# Patient Record
Sex: Female | Born: 1945 | Race: White | Hispanic: No | Marital: Married | Smoking: Never smoker
Health system: Southern US, Community
[De-identification: ages and names within clinical notes are randomized; demographics above are authoritative.]

## PROBLEM LIST (undated history)

## (undated) DIAGNOSIS — R011 Cardiac murmur, unspecified: Secondary | ICD-10-CM

## (undated) DIAGNOSIS — E079 Disorder of thyroid, unspecified: Secondary | ICD-10-CM

## (undated) DIAGNOSIS — T7840XA Allergy, unspecified, initial encounter: Secondary | ICD-10-CM

## (undated) HISTORY — DX: Disorder of thyroid, unspecified: E07.9

## (undated) HISTORY — DX: Allergy, unspecified, initial encounter: T78.40XA

## (undated) HISTORY — DX: Cardiac murmur, unspecified: R01.1

---

## 2014-08-31 DIAGNOSIS — H33002 Unspecified retinal detachment with retinal break, left eye: Secondary | ICD-10-CM | POA: Diagnosis not present

## 2014-08-31 DIAGNOSIS — H43812 Vitreous degeneration, left eye: Secondary | ICD-10-CM | POA: Diagnosis not present

## 2014-11-14 ENCOUNTER — Ambulatory Visit (INDEPENDENT_AMBULATORY_CARE_PROVIDER_SITE_OTHER): Payer: Commercial Managed Care - HMO | Admitting: Emergency Medicine

## 2014-11-14 VITALS — BP 128/80 | HR 70 | Temp 98.4°F | Resp 16 | Ht 62.0 in | Wt 121.8 lb

## 2014-11-14 DIAGNOSIS — J209 Acute bronchitis, unspecified: Secondary | ICD-10-CM | POA: Diagnosis not present

## 2014-11-14 MED ORDER — AZITHROMYCIN 250 MG PO TABS: ORAL_TABLET | ORAL | Status: DC

## 2014-11-14 MED ORDER — HYDROCOD POLST-CPM POLST ER 10-8 MG/5ML PO SUER: 5.0000 mL | Freq: Two times a day (BID) | ORAL | Status: DC

## 2014-11-14 NOTE — Progress Notes (Signed)
Subjective:  Patient ID: Amber Carpenter, female    DOB: [birth year]  Age: 69 y.o. MRN: [id number]  CC: Cough and chest congestion   HPI Amber Carpenter presents  with a week's duration cough. She has a cough productive purulent sputum. She has some nasal congestion with no nasal discharge she has postnasal drainage. She denies any fever chills. No wheezing or shortness of breath. She has no nausea vomiting. No improvement with over-the-counter medication as a history of seasonal allergic rhinitis.  History Amber Carpenter has a past medical history of Allergy and Heart murmur.   She has no past surgical history on file.   Her  family history includes Cancer in her father; Heart disease in her father and mother; Hyperlipidemia in her father; Stroke in her father.  She   reports that she has never smoked. She has never used smokeless tobacco. She reports that she does not drink alcohol or use illicit drugs.  No outpatient prescriptions prior to visit.   No facility-administered medications prior to visit.    Social History   Social History  . Marital Status: Married    Spouse Name: N/A  . Number of Children: N/A  . Years of Education: N/A   Social History Main Topics  . Smoking status: Never Smoker   . Smokeless tobacco: Never Used  . Alcohol Use: No  . Drug Use: No  . Sexual Activity: Not Asked   Other Topics Concern  . None   Social History Narrative  . None     Review of Systems  Constitutional: Negative for fever, chills and appetite change.  HENT: Negative for congestion, ear pain, postnasal drip, sinus pressure and sore throat.   Eyes: Negative for pain and redness.  Respiratory: Positive for cough. Negative for shortness of breath and wheezing.   Cardiovascular: Negative for leg swelling.  Gastrointestinal: Negative for nausea, vomiting, abdominal pain, diarrhea, constipation and blood in stool.  Endocrine: Negative for polyuria.  Genitourinary: Negative for  dysuria, urgency, frequency and flank pain.  Musculoskeletal: Negative for gait problem.  Skin: Negative for rash.  Neurological: Negative for weakness and headaches.  Psychiatric/Behavioral: Negative for confusion and decreased concentration. The patient is not nervous/anxious.     Objective:  BP 128/80 mmHg  Pulse 70  Temp(Src) 98.4 F (36.9 C) (Oral)  Resp 16  Ht 5\' 2"  (1.575 m)  Wt 121 lb 12.8 oz (55.248 kg)  BMI 22.27 kg/m2  SpO2 98%  Physical Exam  Constitutional: She is oriented to person, place, and time. She appears well-developed and well-nourished. No distress.  HENT:  Head: Normocephalic and atraumatic.  Right Ear: External ear normal.  Left Ear: External ear normal.  Nose: Nose normal.  Eyes: Conjunctivae and EOM are normal. Pupils are equal, round, and reactive to light. No scleral icterus.  Neck: Normal range of motion. Neck supple. No tracheal deviation present.  Cardiovascular: Normal rate, regular rhythm and normal heart sounds.   Pulmonary/Chest: Effort normal. No respiratory distress. She has no wheezes. She has no rales.  Abdominal: She exhibits no mass. There is no tenderness. There is no rebound and no guarding.  Musculoskeletal: She exhibits no edema.  Lymphadenopathy:    She has no cervical adenopathy.  Neurological: She is alert and oriented to person, place, and time. Coordination normal.  Skin: Skin is warm and dry. No rash noted.  Psychiatric: She has a normal mood and affect. Her behavior is normal.      Assessment & Plan:  Lendy was seen today for cough and chest congestion.  Diagnoses and all orders for this visit:  Acute bronchitis, unspecified organism  Other orders -     azithromycin (ZITHROMAX) 250 MG tablet; Take 2 tabs PO x 1 dose, then 1 tab PO QD x 4 days -     chlorpheniramine-HYDROcodone (TUSSIONEX PENNKINETIC ER) 10-8 MG/5ML SUER; Take 5 mLs by mouth 2 (two) times daily.  I am having Ms. Bachmann start on azithromycin and  chlorpheniramine-HYDROcodone. I am also having her maintain her guaiFENesin-dextromethorphan.  Meds ordered this encounter  Medications  . guaiFENesin-dextromethorphan (ROBITUSSIN DM) 100-10 MG/5ML syrup    Sig: Take 5 mLs by mouth every 4 (four) hours as needed for cough.  Marland Kitchen azithromycin (ZITHROMAX) 250 MG tablet    Sig: Take 2 tabs PO x 1 dose, then 1 tab PO QD x 4 days    Dispense:  6 tablet    Refill:  0  . chlorpheniramine-HYDROcodone (TUSSIONEX PENNKINETIC ER) 10-8 MG/5ML SUER    Sig: Take 5 mLs by mouth 2 (two) times daily.    Dispense:  60 mL    Refill:  0    Appropriate red flag conditions were discussed with the patient as well as actions that should be taken.  Patient expressed his understanding.  Follow-up: Return if symptoms worsen or fail to improve.  Roselee Culver, MD

## 2014-11-14 NOTE — Patient Instructions (Signed)

## 2014-12-19 DIAGNOSIS — H25812 Combined forms of age-related cataract, left eye: Secondary | ICD-10-CM | POA: Diagnosis not present

## 2014-12-19 DIAGNOSIS — H5203 Hypermetropia, bilateral: Secondary | ICD-10-CM | POA: Diagnosis not present

## 2014-12-19 DIAGNOSIS — H40012 Open angle with borderline findings, low risk, left eye: Secondary | ICD-10-CM | POA: Diagnosis not present

## 2015-07-11 DIAGNOSIS — H2513 Age-related nuclear cataract, bilateral: Secondary | ICD-10-CM | POA: Diagnosis not present

## 2015-07-26 DIAGNOSIS — M19049 Primary osteoarthritis, unspecified hand: Secondary | ICD-10-CM | POA: Diagnosis not present

## 2015-07-26 DIAGNOSIS — M1990 Unspecified osteoarthritis, unspecified site: Secondary | ICD-10-CM | POA: Diagnosis not present

## 2015-07-26 DIAGNOSIS — R4589 Other symptoms and signs involving emotional state: Secondary | ICD-10-CM | POA: Diagnosis not present

## 2015-07-26 DIAGNOSIS — M7701 Medial epicondylitis, right elbow: Secondary | ICD-10-CM | POA: Diagnosis not present

## 2015-08-02 DIAGNOSIS — H2512 Age-related nuclear cataract, left eye: Secondary | ICD-10-CM | POA: Diagnosis not present

## 2015-08-02 DIAGNOSIS — H25812 Combined forms of age-related cataract, left eye: Secondary | ICD-10-CM | POA: Diagnosis not present

## 2015-10-23 ENCOUNTER — Other Ambulatory Visit: Payer: Self-pay | Admitting: Family Medicine

## 2015-10-23 DIAGNOSIS — Z136 Encounter for screening for cardiovascular disorders: Secondary | ICD-10-CM | POA: Diagnosis not present

## 2015-10-23 DIAGNOSIS — Z01419 Encounter for gynecological examination (general) (routine) without abnormal findings: Secondary | ICD-10-CM | POA: Diagnosis not present

## 2015-10-23 DIAGNOSIS — Z1211 Encounter for screening for malignant neoplasm of colon: Secondary | ICD-10-CM | POA: Diagnosis not present

## 2015-10-23 DIAGNOSIS — Z0000 Encounter for general adult medical examination without abnormal findings: Secondary | ICD-10-CM | POA: Diagnosis not present

## 2015-10-23 DIAGNOSIS — E782 Mixed hyperlipidemia: Secondary | ICD-10-CM | POA: Diagnosis not present

## 2015-10-23 DIAGNOSIS — Z23 Encounter for immunization: Secondary | ICD-10-CM | POA: Diagnosis not present

## 2015-11-02 DIAGNOSIS — Z1231 Encounter for screening mammogram for malignant neoplasm of breast: Secondary | ICD-10-CM | POA: Diagnosis not present

## 2015-12-31 DIAGNOSIS — K5790 Diverticulosis of intestine, part unspecified, without perforation or abscess without bleeding: Secondary | ICD-10-CM | POA: Diagnosis not present

## 2015-12-31 DIAGNOSIS — K5730 Diverticulosis of large intestine without perforation or abscess without bleeding: Secondary | ICD-10-CM | POA: Diagnosis not present

## 2015-12-31 DIAGNOSIS — Z1211 Encounter for screening for malignant neoplasm of colon: Secondary | ICD-10-CM | POA: Diagnosis not present

## 2016-01-14 DIAGNOSIS — Z0000 Encounter for general adult medical examination without abnormal findings: Secondary | ICD-10-CM | POA: Diagnosis not present

## 2016-01-14 DIAGNOSIS — E782 Mixed hyperlipidemia: Secondary | ICD-10-CM | POA: Diagnosis not present

## 2016-01-14 DIAGNOSIS — Z136 Encounter for screening for cardiovascular disorders: Secondary | ICD-10-CM | POA: Diagnosis not present

## 2016-01-14 DIAGNOSIS — Z79899 Other long term (current) drug therapy: Secondary | ICD-10-CM | POA: Diagnosis not present

## 2016-01-17 DIAGNOSIS — Z23 Encounter for immunization: Secondary | ICD-10-CM | POA: Diagnosis not present

## 2016-01-17 DIAGNOSIS — M19049 Primary osteoarthritis, unspecified hand: Secondary | ICD-10-CM | POA: Diagnosis not present

## 2016-01-17 DIAGNOSIS — Z6822 Body mass index (BMI) 22.0-22.9, adult: Secondary | ICD-10-CM | POA: Diagnosis not present

## 2016-01-17 DIAGNOSIS — E782 Mixed hyperlipidemia: Secondary | ICD-10-CM | POA: Diagnosis not present

## 2016-01-17 DIAGNOSIS — R946 Abnormal results of thyroid function studies: Secondary | ICD-10-CM | POA: Diagnosis not present

## 2016-02-26 DIAGNOSIS — R946 Abnormal results of thyroid function studies: Secondary | ICD-10-CM | POA: Diagnosis not present

## 2016-02-28 DIAGNOSIS — Z6822 Body mass index (BMI) 22.0-22.9, adult: Secondary | ICD-10-CM | POA: Diagnosis not present

## 2016-02-28 DIAGNOSIS — M19049 Primary osteoarthritis, unspecified hand: Secondary | ICD-10-CM | POA: Diagnosis not present

## 2016-02-28 DIAGNOSIS — R946 Abnormal results of thyroid function studies: Secondary | ICD-10-CM | POA: Diagnosis not present

## 2016-05-08 ENCOUNTER — Ambulatory Visit (INDEPENDENT_AMBULATORY_CARE_PROVIDER_SITE_OTHER): Payer: Medicare HMO | Admitting: Urgent Care

## 2016-05-08 VITALS — BP 120/70 | HR 73 | Temp 98.8°F | Resp 17 | Ht 62.0 in | Wt 126.0 lb

## 2016-05-08 DIAGNOSIS — R0981 Nasal congestion: Secondary | ICD-10-CM

## 2016-05-08 DIAGNOSIS — J3089 Other allergic rhinitis: Secondary | ICD-10-CM

## 2016-05-08 DIAGNOSIS — J0100 Acute maxillary sinusitis, unspecified: Secondary | ICD-10-CM

## 2016-05-08 MED ORDER — PSEUDOEPHEDRINE HCL 60 MG PO TABS: 60.0000 mg | ORAL_TABLET | Freq: Two times a day (BID) | ORAL | 0 refills | Status: AC | PRN

## 2016-05-08 MED ORDER — CETIRIZINE HCL 10 MG PO TABS: 10.0000 mg | ORAL_TABLET | Freq: Every day | ORAL | 11 refills | Status: AC

## 2016-05-08 MED ORDER — AMOXICILLIN 500 MG PO CAPS: 500.0000 mg | ORAL_CAPSULE | Freq: Two times a day (BID) | ORAL | 0 refills | Status: AC

## 2016-05-08 NOTE — Patient Instructions (Addendum)
Sinusitis, Adult Sinusitis is soreness and inflammation of your sinuses. Sinuses are hollow spaces in the bones around your face. Your sinuses are located:  Around your eyes.  In the middle of your forehead.  Behind your nose.  In your cheekbones. Your sinuses and nasal passages are lined with a stringy fluid (mucus). Mucus normally drains out of your sinuses. When your nasal tissues become inflamed or swollen, the mucus can become trapped or blocked so air cannot flow through your sinuses. This allows bacteria, viruses, and funguses to grow, which leads to infection. Sinusitis can develop quickly and last for 7?10 days (acute) or for more than 12 weeks (chronic). Sinusitis often develops after a cold. What are the causes? This condition is caused by anything that creates swelling in the sinuses or stops mucus from draining, including:  Allergies.  Asthma.  Bacterial or viral infection.  Abnormally shaped bones between the nasal passages.  Nasal growths that contain mucus (nasal polyps).  Narrow sinus openings.  Pollutants, such as chemicals or irritants in the air.  A foreign object stuck in the nose.  A fungal infection. This is rare. What increases the risk? The following factors may make you more likely to develop this condition:  Having allergies or asthma.  Having had a recent cold or respiratory tract infection.  Having structural deformities or blockages in your nose or sinuses.  Having a weak immune system.  Doing a lot of swimming or diving.  Overusing nasal sprays.  Smoking. What are the signs or symptoms? The main symptoms of this condition are pain and a feeling of pressure around the affected sinuses. Other symptoms include:  Upper toothache.  Earache.  Headache.  Bad breath.  Decreased sense of smell and taste.  A cough that may get worse at night.  Fatigue.  Fever.  Thick drainage from your nose. The drainage is often green and it may  contain pus (purulent).  Stuffy nose or congestion.  Postnasal drip. This is when extra mucus collects in the throat or back of the nose.  Swelling and warmth over the affected sinuses.  Sore throat.  Sensitivity to light. How is this diagnosed? This condition is diagnosed based on symptoms, a medical history, and a physical exam. To find out if your condition is acute or chronic, your health care provider may:  Look in your nose for signs of nasal polyps.  Tap over the affected sinus to check for signs of infection.  View the inside of your sinuses using an imaging device that has a light attached (endoscope). If your health care provider suspects that you have chronic sinusitis, you may also:  Be tested for allergies.  Have a sample of mucus taken from your nose (nasal culture) and checked for bacteria.  Have a mucus sample examined to see if your sinusitis is related to an allergy. If your sinusitis does not respond to treatment and it lasts longer than 8 weeks, you may have an MRI or CT scan to check your sinuses. These scans also help to determine how severe your infection is. In rare cases, a bone biopsy may be done to rule out more serious types of fungal sinus disease. How is this treated? Treatment for sinusitis depends on the cause and whether your condition is chronic or acute. If a virus is causing your sinusitis, your symptoms will go away on their own within 10 days. You may be given medicines to relieve your symptoms, including:  Topical nasal decongestants. They   shrink swollen nasal passages and let mucus drain from your sinuses.  Antihistamines. These drugs block inflammation that is triggered by allergies. This can help to ease swelling in your nose and sinuses.  Topical nasal corticosteroids. These are nasal sprays that ease inflammation and swelling in your nose and sinuses.  Nasal saline washes. These rinses can help to get rid of thick mucus in your  nose. If your condition is caused by bacteria, you will be given an antibiotic medicine. If your condition is caused by a fungus, you will be given an antifungal medicine. Surgery may be needed to correct underlying conditions, such as narrow nasal passages. Surgery may also be needed to remove polyps. Follow these instructions at home: Medicines  Take, use, or apply over-the-counter and prescription medicines only as told by your health care provider. These may include nasal sprays.  If you were prescribed an antibiotic medicine, take it as told by your health care provider. Do not stop taking the antibiotic even if you start to feel better. Hydrate and Humidify  Drink enough water to keep your urine clear or pale yellow. Staying hydrated will help to thin your mucus.  Use a cool mist humidifier to keep the humidity level in your home above 50%.  Inhale steam for 10-15 minutes, 3-4 times a day or as told by your health care provider. You can do this in the bathroom while a hot shower is running.  Limit your exposure to cool or dry air. Rest  Rest as much as possible.  Sleep with your head raised (elevated).  Make sure to get enough sleep each night. General instructions  Apply a warm, moist washcloth to your face 3-4 times a day or as told by your health care provider. This will help with discomfort.  Wash your hands often with soap and water to reduce your exposure to viruses and other germs. If soap and water are not available, use hand sanitizer.  Do not smoke. Avoid being around people who are smoking (secondhand smoke).  Keep all follow-up visits as told by your health care provider. This is important. Contact a health care provider if:  You have a fever.  Your symptoms get worse.  Your symptoms do not improve within 10 days. Get help right away if:  You have a severe headache.  You have persistent vomiting.  You have pain or swelling around your face or  eyes.  You have vision problems.  You develop confusion.  Your neck is stiff.  You have trouble breathing. This information is not intended to replace advice given to you by your health care provider. Make sure you discuss any questions you have with your health care provider. Document Released: 03/17/2005 Document Revised: 11/11/2015 Document Reviewed: 01/10/2015 Elsevier Interactive Patient Education  2017 [employer].      We recommend that you schedule a mammogram for breast cancer screening. Typically, you do not need a referral to do this. Please contact a local imaging center to schedule your mammogram.  [facility] - [phone]  *ask for the Radiology Department [facility] ([place]) - [phone] or [phone]  [facility] - [phone] [place] [phone] [facility] - [phone]  *ask for the [facility] - [phone]  *ask for the Radiology Department [facility] - [phone]  *ask for the [place] - [phone]    IF you received  an x-ray today, you will receive an invoice from [facility] Radiology. Please contact [facility] Radiology at [phone] with questions or concerns regarding your invoice.   IF you received labwork today, you will receive an invoice from [facility]. Please contact [facility] at [phone] with questions or concerns regarding your invoice.   Our billing staff will not be able to assist you with questions regarding bills from these companies.  You will be contacted with the lab results as soon as they are available. The fastest way to get your results is to activate your My Chart account. Instructions are located on the last page of this paperwork. If you have not heard from [place] regarding the results in 2 weeks, please contact this office.

## 2016-05-08 NOTE — Progress Notes (Signed)
    MRN: [id number] DOB: 1945/05/17  Subjective:   Amber Carpenter is a 71 y.o. female presenting for chief complaint of Cough (onset 3 weeks/ not much now) and Sinusitis (onset 1 week)  Reports 1 week history of sinus congestion, sinus pain, upper tooth pain, subjective fever. Reports 3 week history of productive cough which is now resolved over the past couple of days but still feels chest congestion. Takes Flonase for her allergies which helps with her itchy eyes. Denies sore throat, ear pain, ear drainage, chest pain, shob, n/v, abdominal pain. Denies smoking cigarettes.   Amber Carpenter has a current medication list which includes the following prescription(s): diclofenac sodium, fluticasone, and glucosamine-chondroitin. Also is allergic to epinephrine.  Amber Carpenter  has a past medical history of Allergy and Heart murmur. Also has laparoscopic surgery for miscarriage.  Objective:   Vitals: BP 120/70 (BP Location: Right Arm, Patient Position: Sitting, Cuff Size: Normal)   Pulse 73   Temp 98.8 F (37.1 C) (Oral)   Resp 17   Ht 5\' 2"  (1.575 m)   Wt 126 lb (57.2 kg)   SpO2 100%   BMI 23.05 kg/m   Physical Exam  Constitutional: She is oriented to person, place, and time. She appears well-developed and well-nourished.  HENT:  TM's intact bilaterally, no effusions or erythema. Nasal turbinates erythematous and dry, nasal passages patent. Bilateral maxillary sinus tenderness. Oropharynx clear, mucous membranes moist, dentition in good repair.  Eyes: Right eye exhibits no discharge. Left eye exhibits no discharge.  Neck: Normal range of motion. Neck supple.  Cardiovascular: Normal rate, regular rhythm and intact distal pulses.  Exam reveals no gallop and no friction rub.   No murmur heard. Pulmonary/Chest: No respiratory distress. She has no wheezes. She has no rales.  Lymphadenopathy:    She has no cervical adenopathy.  Neurological: She is alert and oriented to person, place, and time.    Skin: Skin is warm and dry.   Assessment and Plan :   1. Acute maxillary sinusitis, recurrence not specified 2. Sinus congestion 3. Environmental and seasonal allergies - Start Zyrtec with Flonase and use Sudafed 60mg  as needed. If no improvement by Sunday, okay to fill script for amoxicillin to address bacterial sinusitis. Patient verbalized understanding potential for adverse effects including secondary infections such as C. Diff.  Jaynee Eagles, PA-C [facility] [phone] 05/08/2016  4:55 PM

## 2016-07-09 DIAGNOSIS — E039 Hypothyroidism, unspecified: Secondary | ICD-10-CM | POA: Diagnosis not present

## 2016-07-09 DIAGNOSIS — Z79899 Other long term (current) drug therapy: Secondary | ICD-10-CM | POA: Diagnosis not present

## 2016-07-18 DIAGNOSIS — L918 Other hypertrophic disorders of the skin: Secondary | ICD-10-CM | POA: Diagnosis not present

## 2016-07-18 DIAGNOSIS — M19049 Primary osteoarthritis, unspecified hand: Secondary | ICD-10-CM | POA: Diagnosis not present

## 2016-07-18 DIAGNOSIS — E039 Hypothyroidism, unspecified: Secondary | ICD-10-CM | POA: Diagnosis not present

## 2016-07-18 DIAGNOSIS — Z6822 Body mass index (BMI) 22.0-22.9, adult: Secondary | ICD-10-CM | POA: Diagnosis not present

## 2016-08-15 DIAGNOSIS — R05 Cough: Secondary | ICD-10-CM | POA: Diagnosis not present

## 2016-08-15 DIAGNOSIS — J0100 Acute maxillary sinusitis, unspecified: Secondary | ICD-10-CM | POA: Diagnosis not present

## 2016-08-15 DIAGNOSIS — J309 Allergic rhinitis, unspecified: Secondary | ICD-10-CM | POA: Diagnosis not present

## 2016-09-15 DIAGNOSIS — E039 Hypothyroidism, unspecified: Secondary | ICD-10-CM | POA: Diagnosis not present

## 2016-09-16 DIAGNOSIS — M19049 Primary osteoarthritis, unspecified hand: Secondary | ICD-10-CM | POA: Diagnosis not present

## 2016-09-16 DIAGNOSIS — L918 Other hypertrophic disorders of the skin: Secondary | ICD-10-CM | POA: Diagnosis not present

## 2016-09-16 DIAGNOSIS — E039 Hypothyroidism, unspecified: Secondary | ICD-10-CM | POA: Diagnosis not present

## 2016-09-16 DIAGNOSIS — Z6823 Body mass index (BMI) 23.0-23.9, adult: Secondary | ICD-10-CM | POA: Diagnosis not present

## 2017-02-10 DIAGNOSIS — Z23 Encounter for immunization: Secondary | ICD-10-CM | POA: Diagnosis not present

## 2017-02-10 DIAGNOSIS — Z6823 Body mass index (BMI) 23.0-23.9, adult: Secondary | ICD-10-CM | POA: Diagnosis not present

## 2017-02-10 DIAGNOSIS — L918 Other hypertrophic disorders of the skin: Secondary | ICD-10-CM | POA: Diagnosis not present

## 2017-02-10 DIAGNOSIS — E039 Hypothyroidism, unspecified: Secondary | ICD-10-CM | POA: Diagnosis not present

## 2017-02-10 DIAGNOSIS — Z0000 Encounter for general adult medical examination without abnormal findings: Secondary | ICD-10-CM | POA: Diagnosis not present

## 2017-02-17 DIAGNOSIS — L918 Other hypertrophic disorders of the skin: Secondary | ICD-10-CM | POA: Diagnosis not present

## 2017-02-17 DIAGNOSIS — Z0000 Encounter for general adult medical examination without abnormal findings: Secondary | ICD-10-CM | POA: Diagnosis not present

## 2017-02-17 DIAGNOSIS — Z79899 Other long term (current) drug therapy: Secondary | ICD-10-CM | POA: Diagnosis not present

## 2017-04-06 DIAGNOSIS — R946 Abnormal results of thyroid function studies: Secondary | ICD-10-CM | POA: Diagnosis not present

## 2017-04-09 DIAGNOSIS — E039 Hypothyroidism, unspecified: Secondary | ICD-10-CM | POA: Diagnosis not present

## 2017-04-09 DIAGNOSIS — M1990 Unspecified osteoarthritis, unspecified site: Secondary | ICD-10-CM | POA: Diagnosis not present

## 2017-04-09 DIAGNOSIS — Z6822 Body mass index (BMI) 22.0-22.9, adult: Secondary | ICD-10-CM | POA: Diagnosis not present

## 2017-04-09 DIAGNOSIS — E782 Mixed hyperlipidemia: Secondary | ICD-10-CM | POA: Diagnosis not present

## 2017-05-13 DIAGNOSIS — H2511 Age-related nuclear cataract, right eye: Secondary | ICD-10-CM | POA: Diagnosis not present

## 2017-05-13 DIAGNOSIS — H35373 Puckering of macula, bilateral: Secondary | ICD-10-CM | POA: Diagnosis not present

## 2017-05-13 DIAGNOSIS — H5201 Hypermetropia, right eye: Secondary | ICD-10-CM | POA: Diagnosis not present

## 2017-05-28 DIAGNOSIS — H21561 Pupillary abnormality, right eye: Secondary | ICD-10-CM | POA: Diagnosis not present

## 2017-05-28 DIAGNOSIS — H401410 Capsular glaucoma with pseudoexfoliation of lens, right eye, stage unspecified: Secondary | ICD-10-CM | POA: Diagnosis not present

## 2017-05-28 DIAGNOSIS — H25811 Combined forms of age-related cataract, right eye: Secondary | ICD-10-CM | POA: Diagnosis not present

## 2017-05-28 DIAGNOSIS — H2511 Age-related nuclear cataract, right eye: Secondary | ICD-10-CM | POA: Diagnosis not present

## 2017-06-09 DIAGNOSIS — R946 Abnormal results of thyroid function studies: Secondary | ICD-10-CM | POA: Diagnosis not present

## 2017-06-11 DIAGNOSIS — E039 Hypothyroidism, unspecified: Secondary | ICD-10-CM | POA: Diagnosis not present

## 2017-06-11 DIAGNOSIS — L570 Actinic keratosis: Secondary | ICD-10-CM | POA: Diagnosis not present

## 2017-06-11 DIAGNOSIS — L853 Xerosis cutis: Secondary | ICD-10-CM | POA: Diagnosis not present

## 2017-06-11 DIAGNOSIS — Z6823 Body mass index (BMI) 23.0-23.9, adult: Secondary | ICD-10-CM | POA: Diagnosis not present

## 2017-12-14 DIAGNOSIS — E039 Hypothyroidism, unspecified: Secondary | ICD-10-CM | POA: Diagnosis not present

## 2017-12-17 DIAGNOSIS — J309 Allergic rhinitis, unspecified: Secondary | ICD-10-CM | POA: Diagnosis not present

## 2017-12-17 DIAGNOSIS — H6091 Unspecified otitis externa, right ear: Secondary | ICD-10-CM | POA: Diagnosis not present

## 2017-12-17 DIAGNOSIS — E039 Hypothyroidism, unspecified: Secondary | ICD-10-CM | POA: Diagnosis not present

## 2017-12-17 DIAGNOSIS — Z6823 Body mass index (BMI) 23.0-23.9, adult: Secondary | ICD-10-CM | POA: Diagnosis not present

## 2017-12-25 DIAGNOSIS — Z23 Encounter for immunization: Secondary | ICD-10-CM | POA: Diagnosis not present

## 2017-12-30 DIAGNOSIS — H35373 Puckering of macula, bilateral: Secondary | ICD-10-CM | POA: Diagnosis not present

## 2017-12-30 DIAGNOSIS — H40013 Open angle with borderline findings, low risk, bilateral: Secondary | ICD-10-CM | POA: Diagnosis not present

## 2018-02-16 DIAGNOSIS — Z6823 Body mass index (BMI) 23.0-23.9, adult: Secondary | ICD-10-CM | POA: Diagnosis not present

## 2018-02-16 DIAGNOSIS — Z1211 Encounter for screening for malignant neoplasm of colon: Secondary | ICD-10-CM | POA: Diagnosis not present

## 2018-02-16 DIAGNOSIS — Z0000 Encounter for general adult medical examination without abnormal findings: Secondary | ICD-10-CM | POA: Diagnosis not present

## 2018-04-05 DIAGNOSIS — E039 Hypothyroidism, unspecified: Secondary | ICD-10-CM | POA: Diagnosis not present

## 2018-04-05 DIAGNOSIS — E782 Mixed hyperlipidemia: Secondary | ICD-10-CM | POA: Diagnosis not present

## 2018-04-12 DIAGNOSIS — J309 Allergic rhinitis, unspecified: Secondary | ICD-10-CM | POA: Diagnosis not present

## 2018-04-12 DIAGNOSIS — E039 Hypothyroidism, unspecified: Secondary | ICD-10-CM | POA: Diagnosis not present

## 2018-04-12 DIAGNOSIS — Z6823 Body mass index (BMI) 23.0-23.9, adult: Secondary | ICD-10-CM | POA: Diagnosis not present

## 2018-04-12 DIAGNOSIS — E782 Mixed hyperlipidemia: Secondary | ICD-10-CM | POA: Diagnosis not present

## 2018-09-15 DIAGNOSIS — J309 Allergic rhinitis, unspecified: Secondary | ICD-10-CM | POA: Diagnosis not present

## 2019-04-13 DIAGNOSIS — D23122 Other benign neoplasm of skin of left lower eyelid, including canthus: Secondary | ICD-10-CM | POA: Diagnosis not present

## 2019-04-13 DIAGNOSIS — Z961 Presence of intraocular lens: Secondary | ICD-10-CM | POA: Diagnosis not present

## 2019-04-13 DIAGNOSIS — H35373 Puckering of macula, bilateral: Secondary | ICD-10-CM | POA: Diagnosis not present

## 2019-04-13 DIAGNOSIS — H02105 Unspecified ectropion of left lower eyelid: Secondary | ICD-10-CM | POA: Diagnosis not present

## 2019-04-22 ENCOUNTER — Ambulatory Visit: Payer: Medicare HMO | Attending: Internal Medicine

## 2019-04-22 DIAGNOSIS — Z23 Encounter for immunization: Secondary | ICD-10-CM | POA: Insufficient documentation

## 2019-04-22 NOTE — Progress Notes (Signed)
   Covid-19 Vaccination Clinic  Name:  Amber Carpenter    MRN: [id number] DOB: Mar 22, 1946  04/22/2019  Amber Carpenter was observed post Covid-19 immunization for 15 minutes without incidence. She was provided with Vaccine Information Sheet and instruction to access the V-Safe system.   Amber Carpenter was instructed to call 911 with any severe reactions post vaccine: Marland Kitchen Difficulty breathing  . Swelling of your face and throat  . A fast heartbeat  . A bad rash all over your body  . Dizziness and weakness    Immunizations Administered    Name Date Dose VIS Date Route   Pfizer COVID-19 Vaccine 04/22/2019  8:43 AM 0.3 mL 03/11/2019 Intramuscular   Manufacturer: [place]   Lot: [id number]   [place]: [id number]

## 2019-05-13 ENCOUNTER — Ambulatory Visit: Payer: Medicare HMO | Attending: Internal Medicine

## 2019-05-13 DIAGNOSIS — Z23 Encounter for immunization: Secondary | ICD-10-CM | POA: Insufficient documentation

## 2019-05-13 NOTE — Progress Notes (Signed)
   Covid-19 Vaccination Clinic  Name:  Amber Carpenter    MRN: [id number] DOB: Oct 11, 1945  05/13/2019  [name] was observed post Covid-19 immunization for 15 minutes without incidence. She was provided with Vaccine Information Sheet and instruction to access the V-Safe system.   [name] was instructed to call 911 with any severe reactions post vaccine: Marland Kitchen Difficulty breathing  . Swelling of your face and throat  . A fast heartbeat  . A bad rash all over your body  . Dizziness and weakness    Immunizations Administered    Name Date Dose VIS Date Route   Pfizer COVID-19 Vaccine 05/13/2019  9:34 AM 0.3 mL 03/11/2019 Intramuscular   Manufacturer: [place]   Lot: [id number]   [place]: [id number]

## 2019-05-19 DIAGNOSIS — J309 Allergic rhinitis, unspecified: Secondary | ICD-10-CM | POA: Diagnosis not present

## 2019-05-19 DIAGNOSIS — Z0000 Encounter for general adult medical examination without abnormal findings: Secondary | ICD-10-CM | POA: Diagnosis not present

## 2019-05-19 DIAGNOSIS — E039 Hypothyroidism, unspecified: Secondary | ICD-10-CM | POA: Diagnosis not present

## 2019-05-19 DIAGNOSIS — Z6824 Body mass index (BMI) 24.0-24.9, adult: Secondary | ICD-10-CM | POA: Diagnosis not present

## 2019-05-19 DIAGNOSIS — E782 Mixed hyperlipidemia: Secondary | ICD-10-CM | POA: Diagnosis not present

## 2019-05-26 DIAGNOSIS — Z23 Encounter for immunization: Secondary | ICD-10-CM | POA: Diagnosis not present

## 2019-06-22 DIAGNOSIS — L600 Ingrowing nail: Secondary | ICD-10-CM | POA: Diagnosis not present

## 2019-07-12 DIAGNOSIS — Z23 Encounter for immunization: Secondary | ICD-10-CM | POA: Diagnosis not present

## 2019-07-13 DIAGNOSIS — Z0000 Encounter for general adult medical examination without abnormal findings: Secondary | ICD-10-CM | POA: Diagnosis not present

## 2019-07-13 DIAGNOSIS — R635 Abnormal weight gain: Secondary | ICD-10-CM | POA: Diagnosis not present

## 2019-07-13 DIAGNOSIS — E782 Mixed hyperlipidemia: Secondary | ICD-10-CM | POA: Diagnosis not present

## 2019-07-13 DIAGNOSIS — R946 Abnormal results of thyroid function studies: Secondary | ICD-10-CM | POA: Diagnosis not present

## 2019-07-19 DIAGNOSIS — E782 Mixed hyperlipidemia: Secondary | ICD-10-CM | POA: Diagnosis not present

## 2019-07-19 DIAGNOSIS — J309 Allergic rhinitis, unspecified: Secondary | ICD-10-CM | POA: Diagnosis not present

## 2019-07-19 DIAGNOSIS — E039 Hypothyroidism, unspecified: Secondary | ICD-10-CM | POA: Diagnosis not present

## 2019-10-10 DIAGNOSIS — E782 Mixed hyperlipidemia: Secondary | ICD-10-CM | POA: Diagnosis not present

## 2019-10-10 DIAGNOSIS — Z9189 Other specified personal risk factors, not elsewhere classified: Secondary | ICD-10-CM | POA: Diagnosis not present

## 2019-10-11 DIAGNOSIS — E1169 Type 2 diabetes mellitus with other specified complication: Secondary | ICD-10-CM | POA: Diagnosis not present

## 2019-10-11 DIAGNOSIS — E782 Mixed hyperlipidemia: Secondary | ICD-10-CM | POA: Diagnosis not present

## 2019-10-11 DIAGNOSIS — J309 Allergic rhinitis, unspecified: Secondary | ICD-10-CM | POA: Diagnosis not present

## 2019-10-11 DIAGNOSIS — M170 Bilateral primary osteoarthritis of knee: Secondary | ICD-10-CM | POA: Diagnosis not present

## 2020-01-30 ENCOUNTER — Ambulatory Visit
Admission: RE | Admit: 2020-01-30 | Discharge: 2020-01-30 | Disposition: A | Discharge status: Home / Self Care | Payer: Medicare HMO | Source: Ambulatory Visit | Attending: Family Medicine | Admitting: Family Medicine

## 2020-01-30 ENCOUNTER — Other Ambulatory Visit: Payer: Self-pay | Admitting: Family Medicine

## 2020-01-30 DIAGNOSIS — S8992XA Unspecified injury of left lower leg, initial encounter: Secondary | ICD-10-CM | POA: Diagnosis not present

## 2020-01-30 DIAGNOSIS — G4700 Insomnia, unspecified: Secondary | ICD-10-CM | POA: Diagnosis not present

## 2020-01-30 DIAGNOSIS — M25562 Pain in left knee: Secondary | ICD-10-CM

## 2020-01-30 DIAGNOSIS — M25561 Pain in right knee: Secondary | ICD-10-CM

## 2020-01-30 DIAGNOSIS — M1990 Unspecified osteoarthritis, unspecified site: Secondary | ICD-10-CM | POA: Diagnosis not present

## 2020-01-30 DIAGNOSIS — M1711 Unilateral primary osteoarthritis, right knee: Secondary | ICD-10-CM | POA: Diagnosis not present

## 2020-01-30 DIAGNOSIS — M25462 Effusion, left knee: Secondary | ICD-10-CM | POA: Diagnosis not present

## 2020-02-02 DIAGNOSIS — M170 Bilateral primary osteoarthritis of knee: Secondary | ICD-10-CM | POA: Diagnosis not present

## 2020-02-02 DIAGNOSIS — M1712 Unilateral primary osteoarthritis, left knee: Secondary | ICD-10-CM | POA: Diagnosis not present

## 2020-02-02 DIAGNOSIS — M1711 Unilateral primary osteoarthritis, right knee: Secondary | ICD-10-CM | POA: Diagnosis not present

## 2020-02-20 DIAGNOSIS — Z20822 Contact with and (suspected) exposure to covid-19: Secondary | ICD-10-CM | POA: Diagnosis not present

## 2020-03-06 DIAGNOSIS — Z7185 Encounter for immunization safety counseling: Secondary | ICD-10-CM | POA: Diagnosis not present

## 2020-03-06 DIAGNOSIS — M170 Bilateral primary osteoarthritis of knee: Secondary | ICD-10-CM | POA: Diagnosis not present

## 2020-03-06 DIAGNOSIS — Z23 Encounter for immunization: Secondary | ICD-10-CM | POA: Diagnosis not present

## 2020-08-01 DIAGNOSIS — E782 Mixed hyperlipidemia: Secondary | ICD-10-CM | POA: Diagnosis not present

## 2020-08-07 DIAGNOSIS — M1711 Unilateral primary osteoarthritis, right knee: Secondary | ICD-10-CM | POA: Diagnosis not present

## 2020-08-07 DIAGNOSIS — M1712 Unilateral primary osteoarthritis, left knee: Secondary | ICD-10-CM | POA: Diagnosis not present

## 2020-08-07 DIAGNOSIS — E782 Mixed hyperlipidemia: Secondary | ICD-10-CM | POA: Diagnosis not present

## 2020-08-07 DIAGNOSIS — M170 Bilateral primary osteoarthritis of knee: Secondary | ICD-10-CM | POA: Diagnosis not present

## 2020-08-07 DIAGNOSIS — Z23 Encounter for immunization: Secondary | ICD-10-CM | POA: Diagnosis not present

## 2020-08-07 DIAGNOSIS — J309 Allergic rhinitis, unspecified: Secondary | ICD-10-CM | POA: Diagnosis not present

## 2020-08-30 DIAGNOSIS — Z1331 Encounter for screening for depression: Secondary | ICD-10-CM | POA: Diagnosis not present

## 2020-08-30 DIAGNOSIS — Z1339 Encounter for screening examination for other mental health and behavioral disorders: Secondary | ICD-10-CM | POA: Diagnosis not present

## 2020-08-30 DIAGNOSIS — Z0000 Encounter for general adult medical examination without abnormal findings: Secondary | ICD-10-CM | POA: Diagnosis not present

## 2020-09-03 ENCOUNTER — Other Ambulatory Visit: Payer: Self-pay | Admitting: Family Medicine

## 2020-09-03 DIAGNOSIS — E2839 Other primary ovarian failure: Secondary | ICD-10-CM

## 2021-02-01 DIAGNOSIS — E559 Vitamin D deficiency, unspecified: Secondary | ICD-10-CM | POA: Diagnosis not present

## 2021-02-01 DIAGNOSIS — E782 Mixed hyperlipidemia: Secondary | ICD-10-CM | POA: Diagnosis not present

## 2021-02-01 DIAGNOSIS — E039 Hypothyroidism, unspecified: Secondary | ICD-10-CM | POA: Diagnosis not present

## 2021-02-07 DIAGNOSIS — M179 Osteoarthritis of knee, unspecified: Secondary | ICD-10-CM | POA: Diagnosis not present

## 2021-02-07 DIAGNOSIS — E559 Vitamin D deficiency, unspecified: Secondary | ICD-10-CM | POA: Diagnosis not present

## 2021-02-07 DIAGNOSIS — Z23 Encounter for immunization: Secondary | ICD-10-CM | POA: Diagnosis not present

## 2021-02-07 DIAGNOSIS — E782 Mixed hyperlipidemia: Secondary | ICD-10-CM | POA: Diagnosis not present

## 2021-02-07 DIAGNOSIS — E039 Hypothyroidism, unspecified: Secondary | ICD-10-CM | POA: Diagnosis not present

## 2021-05-13 DIAGNOSIS — E559 Vitamin D deficiency, unspecified: Secondary | ICD-10-CM | POA: Diagnosis not present

## 2021-05-13 DIAGNOSIS — E039 Hypothyroidism, unspecified: Secondary | ICD-10-CM | POA: Diagnosis not present

## 2021-05-13 DIAGNOSIS — E782 Mixed hyperlipidemia: Secondary | ICD-10-CM | POA: Diagnosis not present

## 2021-05-16 DIAGNOSIS — E782 Mixed hyperlipidemia: Secondary | ICD-10-CM | POA: Diagnosis not present

## 2021-05-16 DIAGNOSIS — E039 Hypothyroidism, unspecified: Secondary | ICD-10-CM | POA: Diagnosis not present

## 2021-05-16 DIAGNOSIS — E559 Vitamin D deficiency, unspecified: Secondary | ICD-10-CM | POA: Diagnosis not present

## 2021-09-09 DIAGNOSIS — E782 Mixed hyperlipidemia: Secondary | ICD-10-CM | POA: Diagnosis not present

## 2021-09-09 DIAGNOSIS — E039 Hypothyroidism, unspecified: Secondary | ICD-10-CM | POA: Diagnosis not present

## 2021-09-12 DIAGNOSIS — J309 Allergic rhinitis, unspecified: Secondary | ICD-10-CM | POA: Diagnosis not present

## 2021-09-12 DIAGNOSIS — Z7185 Encounter for immunization safety counseling: Secondary | ICD-10-CM | POA: Diagnosis not present

## 2021-09-12 DIAGNOSIS — Z23 Encounter for immunization: Secondary | ICD-10-CM | POA: Diagnosis not present

## 2021-09-12 DIAGNOSIS — E782 Mixed hyperlipidemia: Secondary | ICD-10-CM | POA: Diagnosis not present

## 2021-09-12 DIAGNOSIS — E039 Hypothyroidism, unspecified: Secondary | ICD-10-CM | POA: Diagnosis not present

## 2021-10-24 DIAGNOSIS — Z0000 Encounter for general adult medical examination without abnormal findings: Secondary | ICD-10-CM | POA: Diagnosis not present

## 2021-10-24 DIAGNOSIS — Z1331 Encounter for screening for depression: Secondary | ICD-10-CM | POA: Diagnosis not present

## 2021-10-24 DIAGNOSIS — Z1339 Encounter for screening examination for other mental health and behavioral disorders: Secondary | ICD-10-CM | POA: Diagnosis not present

## 2021-12-23 DIAGNOSIS — D23122 Other benign neoplasm of skin of left lower eyelid, including canthus: Secondary | ICD-10-CM | POA: Diagnosis not present

## 2022-06-12 DIAGNOSIS — Z23 Encounter for immunization: Secondary | ICD-10-CM | POA: Diagnosis not present

## 2022-06-12 DIAGNOSIS — E559 Vitamin D deficiency, unspecified: Secondary | ICD-10-CM | POA: Diagnosis not present

## 2022-06-12 DIAGNOSIS — S8000XA Contusion of unspecified knee, initial encounter: Secondary | ICD-10-CM | POA: Diagnosis not present

## 2022-06-12 DIAGNOSIS — Z7185 Encounter for immunization safety counseling: Secondary | ICD-10-CM | POA: Diagnosis not present

## 2022-06-12 DIAGNOSIS — E039 Hypothyroidism, unspecified: Secondary | ICD-10-CM | POA: Diagnosis not present

## 2022-06-12 DIAGNOSIS — E782 Mixed hyperlipidemia: Secondary | ICD-10-CM | POA: Diagnosis not present

## 2022-07-15 DIAGNOSIS — Z961 Presence of intraocular lens: Secondary | ICD-10-CM | POA: Diagnosis not present

## 2022-07-15 DIAGNOSIS — H52203 Unspecified astigmatism, bilateral: Secondary | ICD-10-CM | POA: Diagnosis not present

## 2022-07-15 DIAGNOSIS — H35373 Puckering of macula, bilateral: Secondary | ICD-10-CM | POA: Diagnosis not present

## 2022-07-23 NOTE — Progress Notes (Signed)
[facility] - Clinic Note  07/29/2022   CHIEF COMPLAINT Patient presents for Retina Evaluation  HISTORY OF PRESENT ILLNESS: Amber Carpenter is a 77 y.o. female who presents to the clinic today for:  HPI     Retina Evaluation   In left eye.  This started 6 months ago.  Duration of 6 months.  Associated Symptoms Distortion.  Context:  distance vision and reading.  Response to treatment was no improvement.  I, the attending physician,  performed the HPI with the patient and updated documentation appropriately.        Comments   New pt referred by Dr. Charlotte Sanes for ERM OS. Pt states VA in OS has decreased over the last 6 months. Has noticed waviness in letters when reading w/ OS. Pt has a hx of retinal break OS, had laser treatment 2-3 years ago w/ Dr. Duke Salvia. Wears OTC readers.       Last edited by Rennis Chris, MD on 07/29/2022  5:46 PM.    Pt is here on the referral of Dr. Charlotte Sanes for concern of ERM, pt states her vision has gotten worse over the last 6 months, she states her vision is blurry and she also sees waviness when trying to read, pt has hx of retinal tear OS repaired by Dr. Duke Salvia, pt denies being hypertensive or diabetic, she only takes medication for thyroid and cholesterol (only once a week)   Referring physician: Maris Berger, MD [address] [place],  [place] [address]  HISTORICAL INFORMATION:  Selected notes from the MEDICAL RECORD NUMBER Referred by Dr. Charlotte Sanes for ERM OS LEE:  Ocular Hx- PMH-   CURRENT MEDICATIONS: No current outpatient medications on file. (Ophthalmic Drugs)   No current facility-administered medications for this visit. (Ophthalmic Drugs)   Current Outpatient Medications (Other)  Medication Sig   azelastine (ASTELIN) 0.1 % nasal spray Place 1 spray into both nostrils 2 (two) times daily. Use in each nostril as directed   cetirizine (ZYRTEC) 10 MG tablet Take 1 tablet (10 mg total) by mouth daily.   diclofenac  sodium (VOLTAREN) 1 % GEL Apply 1 application topically as needed.   levothyroxine (SYNTHROID) 75 MCG tablet Take 75 mcg by mouth daily before breakfast.   rosuvastatin (CRESTOR) 5 MG tablet Take 5 mg by mouth once a week.   amoxicillin (AMOXIL) 500 MG capsule Take 1 capsule (500 mg total) by mouth 2 (two) times daily.   fluticasone (FLONASE) 50 MCG/ACT nasal spray Place into both nostrils daily. (Patient not taking: Reported on 07/29/2022)   glucosamine-chondroitin 500-400 MG tablet Take 1 tablet by mouth 3 (three) times daily.   pseudoephedrine (SUDAFED) 60 MG tablet Take 1 tablet (60 mg total) by mouth every 12 (twelve) hours as needed for congestion.   No current facility-administered medications for this visit. (Other)   REVIEW OF SYSTEMS: ROS   Positive for: Endocrine, Eyes Negative for: Constitutional, Gastrointestinal, Neurological, Skin, Genitourinary, Musculoskeletal, HENT, Cardiovascular, Respiratory, Psychiatric, Allergic/Imm, Heme/Lymph Last edited by Thompson Grayer, COT on 07/29/2022  8:24 AM.     ALLERGIES Allergies  Allergen Reactions   Epinephrine Palpitations   PAST MEDICAL HISTORY Past Medical History:  Diagnosis Date   Allergy    Heart murmur    Thyroid disease    History reviewed. No pertinent surgical history. FAMILY HISTORY Family History  Problem Relation Age of Onset   Glaucoma Mother    Heart disease Mother    Macular degeneration Father  Cancer Father    Heart disease Father    Hyperlipidemia Father    Stroke Father    SOCIAL HISTORY Social History   Tobacco Use   Smoking status: Never   Smokeless tobacco: Never  Substance Use Topics   Alcohol use: Yes    Comment: Occasionally   Drug use: No       OPHTHALMIC EXAM:  Base Eye Exam     Visual Acuity (Snellen - Linear)       Right Left   Dist [place] 20/20 -2 20/25 -2   Dist ph [place]  20/20 -2         Tonometry (Tonopen, 8:32 AM)       Right Left   Pressure 14 14          Pupils       Pupils Dark Light Shape React APD   Right PERRL 3 2 Round Brisk None   Left PERRL 3 2 Round Brisk None         Visual Fields (Counting fingers)       Left Right    Full Full         Extraocular Movement       Right Left    Full, Ortho Full, Ortho         Neuro/Psych     Oriented x3: Yes   Mood/Affect: Normal         Dilation     Both eyes: 1.0% Mydriacyl, 2.5% Phenylephrine @ 8:32 AM           Slit Lamp and Fundus Exam     Slit Lamp Exam       Right Left   Lids/Lashes Dermatochalasis - upper lid Dermatochalasis - upper lid   Conjunctiva/Sclera White and quiet White and quiet   Cornea tear film debris, well healed cataract wound tear film debris, well healed cataract wound   Anterior Chamber deep and clear deep and clear   Iris Round and moderately dilated round and poorly dilated, Transillumination defects at 0200   Lens PC IOL in good position PC IOL in good position   Anterior Vitreous Vitreous syneresis, Posterior vitreous detachment Vitreous syneresis, Posterior vitreous detachment         Fundus Exam       Right Left   Disc Pink and Sharp Pink and Sharp   C/D Ratio 0.6 0.4   Macula Flat, Good foveal reflex, mild RPE mottling, mild ERM Flat, blunted foveal reflex, ERM with mild striae and central cystic changes, no heme   Vessels mild attenuation, mild tortuosity attenuated, mild tortuosity   Periphery Attached, scattered peripheral drusen, No heme Attached, old laser scars at 0130, operculated hole with laser scarring at 0430, scattered peripheral drusen, No heme           IMAGING AND PROCEDURES  Imaging and Procedures for 07/29/2022  OCT, Retina - OU - Both Eyes       Right Eye Quality was good. Central Foveal Thickness: 274. Progression has no prior data. Findings include normal foveal contour, no IRF, no SRF, epiretinal membrane, macular pucker (Mild ERM with pucker).   Left Eye Quality was good. Central Foveal  Thickness: 432. Progression has no prior data. Findings include no SRF, abnormal foveal contour, epiretinal membrane, intraretinal fluid, macular pucker (ERM with pucker and central cystic changes / foveoschisis).   Notes *Images captured and stored on drive  Diagnosis / Impression:  OD: mild ERM with pucker OS: ERM with  pucker and central cystic changes / foveoschisis  Clinical management:  See below  Abbreviations: NFP - Normal foveal profile. CME - cystoid macular edema. PED - pigment epithelial detachment. IRF - intraretinal fluid. SRF - subretinal fluid. EZ - ellipsoid zone. ERM - epiretinal membrane. ORA - outer retinal atrophy. ORT - outer retinal tubulation. SRHM - subretinal hyper-reflective material. IRHM - intraretinal hyper-reflective material           ASSESSMENT/PLAN:   ICD-10-CM   1. Epiretinal membrane (ERM) of both eyes  H35.373 OCT, Retina - OU - Both Eyes    2. History of retinal tear  Z86.69     3. Pseudophakia, both eyes  Z96.1      Epiretinal membrane, both eyes (OS > OD) - The natural history, anatomy, potential for loss of vision, and treatment options including vitrectomy techniques and the complications of endophthalmitis, retinal detachment, vitreous hemorrhage, cataract progression and permanent vision loss discussed with the patient. - OD: very mild focal ERM superior macula - OS: ERM w/ pucker and cystic changes / foveoschisis - BCVA 20/20 OU - pt reports metamorphopsia OS only -- started about 6 mos ago (October 2024) - no indication for surgery at this time - monitor for now - f/u 4 mos -- DFE/OCT  2. History of retinal tear OS  - repaired via laser retinopexy by Dr. Duke Salvia  - stable w/ good peripheral laser changes  - no new RT/RD  3. Pseudophakia OU  - s/p CE/IOL (Dr. Charlotte Sanes)  - IOL in good position, doing well  - monitor  Ophthalmic Meds Ordered this visit:  No orders of the defined types were placed in this encounter.    Return  in about 4 months (around 11/28/2022) for f/u ERM OU, DFE, OCT.  There are no Patient Instructions on file for this visit.  Explained the diagnoses, plan, and follow up with the patient and they expressed understanding.  Patient expressed understanding of the importance of proper follow up care.   This document serves as a record of services personally performed by Karie Chimera, MD, PhD. It was created on their behalf by Gerilyn Nestle, COT an ophthalmic technician. The creation of this record is the provider's dictation and/or activities during the visit.    Electronically signed by:  Gerilyn Nestle, COT  04.24.24 5:48 PM  This document serves as a record of services personally performed by Karie Chimera, MD, PhD. It was created on their behalf by Glee Arvin. Manson Passey, OA an ophthalmic technician. The creation of this record is the provider's dictation and/or activities during the visit.    Electronically signed by: Glee Arvin. Manson Passey, [place] 04.30.2024 5:48 PM  Karie Chimera, M.D., Ph.D. Diseases & Surgery of the Retina and Vitreous [facility] 07/29/2022  I have reviewed the above documentation for accuracy and completeness, and I agree with the above. Karie Chimera, M.D., Ph.D. 07/29/22 5:50 PM  Abbreviations: M myopia (nearsighted); A astigmatism; H hyperopia (farsighted); P presbyopia; Mrx spectacle prescription;  CTL contact lenses; OD right eye; OS left eye; OU both eyes  XT exotropia; ET esotropia; PEK punctate epithelial keratitis; PEE punctate epithelial erosions; DES dry eye syndrome; MGD meibomian gland dysfunction; ATs artificial tears; PFAT's preservative free artificial tears; NSC nuclear sclerotic cataract; PSC posterior subcapsular cataract; ERM epi-retinal membrane; PVD posterior vitreous detachment; RD retinal detachment; DM diabetes mellitus; DR diabetic retinopathy; NPDR non-proliferative diabetic retinopathy; PDR proliferative diabetic  retinopathy; CSME clinically significant macular edema;  DME diabetic macular edema; dbh dot blot hemorrhages; CWS cotton wool spot; POAG primary open angle glaucoma; C/D cup-to-disc ratio; HVF humphrey visual field; GVF goldmann visual field; OCT optical coherence tomography; IOP intraocular pressure; BRVO Branch retinal vein occlusion; CRVO central retinal vein occlusion; CRAO central retinal artery occlusion; BRAO branch retinal artery occlusion; RT retinal tear; SB scleral buckle; PPV pars plana vitrectomy; VH Vitreous hemorrhage; PRP panretinal laser photocoagulation; IVK intravitreal kenalog; VMT vitreomacular traction; MH Macular hole;  NVD neovascularization of the disc; NVE neovascularization elsewhere; AREDS age related eye disease study; ARMD age related macular degeneration; POAG primary open angle glaucoma; EBMD epithelial/anterior basement membrane dystrophy; ACIOL anterior chamber intraocular lens; IOL intraocular lens; PCIOL posterior chamber intraocular lens; Phaco/IOL phacoemulsification with intraocular lens placement; [place] photorefractive keratectomy; LASIK laser assisted in situ keratomileusis; HTN hypertension; DM diabetes mellitus; COPD chronic obstructive pulmonary disease

## 2022-07-29 ENCOUNTER — Ambulatory Visit (INDEPENDENT_AMBULATORY_CARE_PROVIDER_SITE_OTHER): Payer: Medicare HMO | Admitting: Ophthalmology

## 2022-07-29 ENCOUNTER — Encounter (INDEPENDENT_AMBULATORY_CARE_PROVIDER_SITE_OTHER): Payer: Self-pay | Admitting: Ophthalmology

## 2022-07-29 DIAGNOSIS — Z8669 Personal history of other diseases of the nervous system and sense organs: Secondary | ICD-10-CM | POA: Diagnosis not present

## 2022-07-29 DIAGNOSIS — H35373 Puckering of macula, bilateral: Secondary | ICD-10-CM | POA: Diagnosis not present

## 2022-07-29 DIAGNOSIS — Z961 Presence of intraocular lens: Secondary | ICD-10-CM

## 2022-09-26 IMAGING — DX DG KNEE COMPLETE 4+V*R*
4 series · 4 of 4 positions shown · non-contrast
Comparison: None.

CLINICAL DATA: Bilateral knee pain

EXAM:
RIGHT KNEE - COMPLETE 4+ VIEW

[dg knee complete 4 views right (1 of 4)]
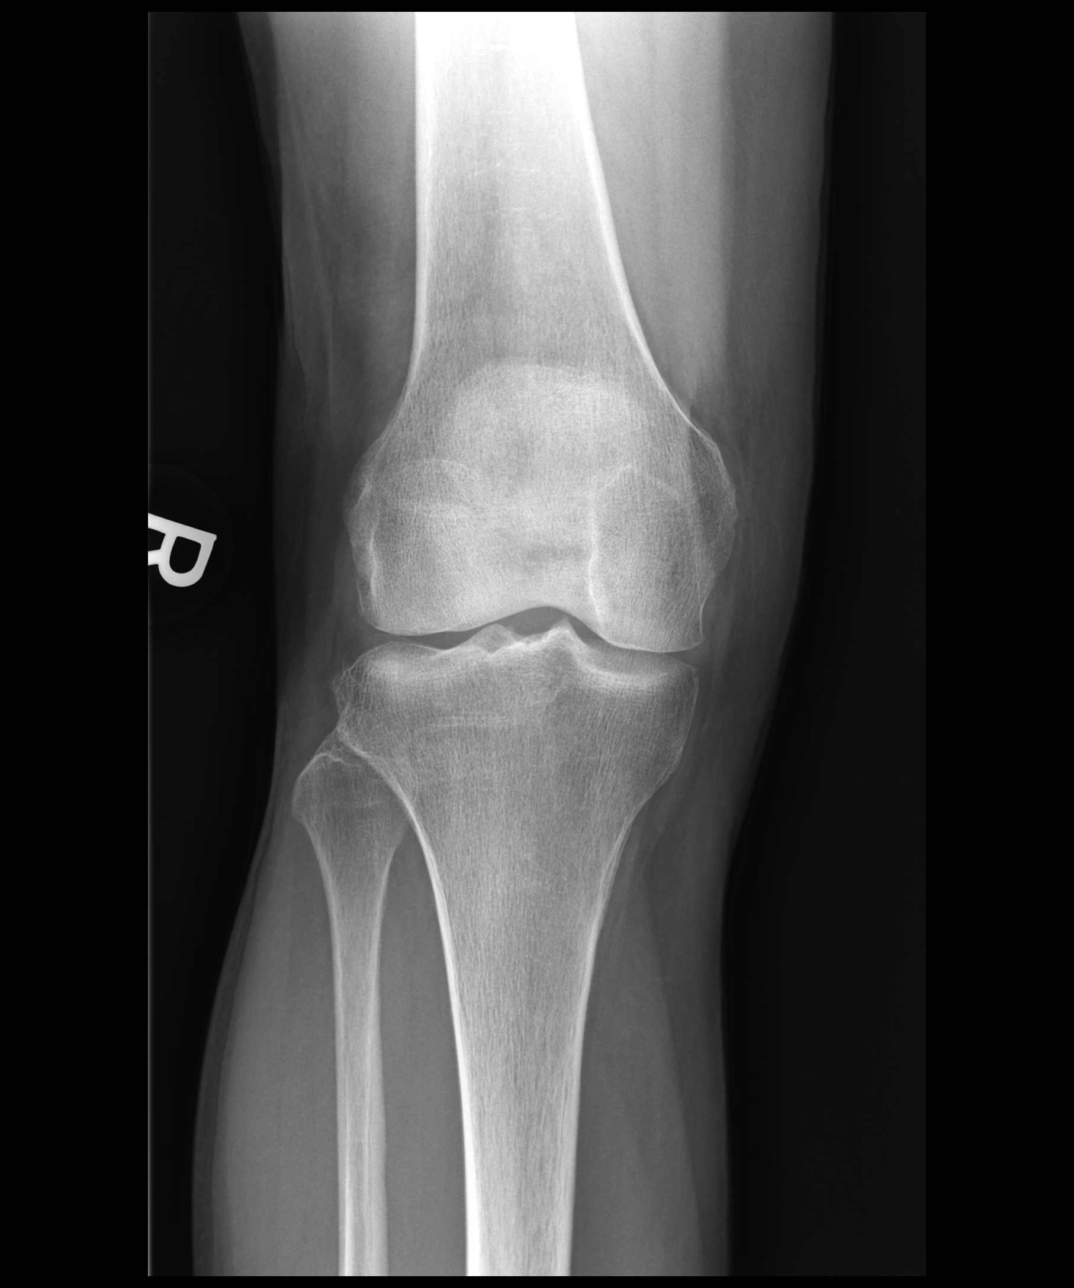

[dg knee complete 4 views right (2 of 4)]
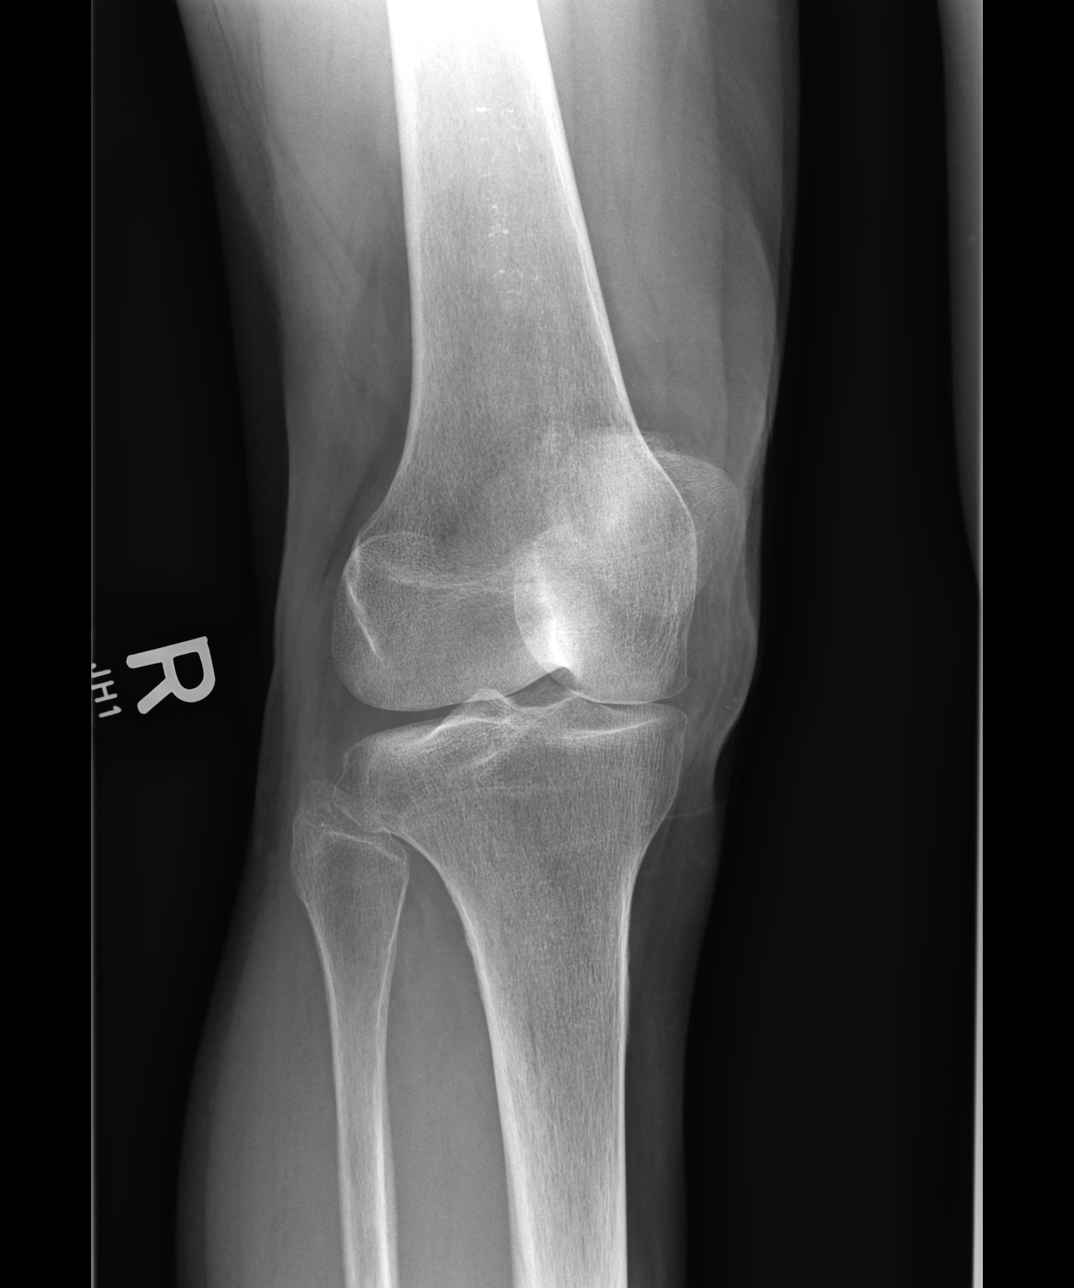

[dg knee complete 4 views right (3 of 4)]
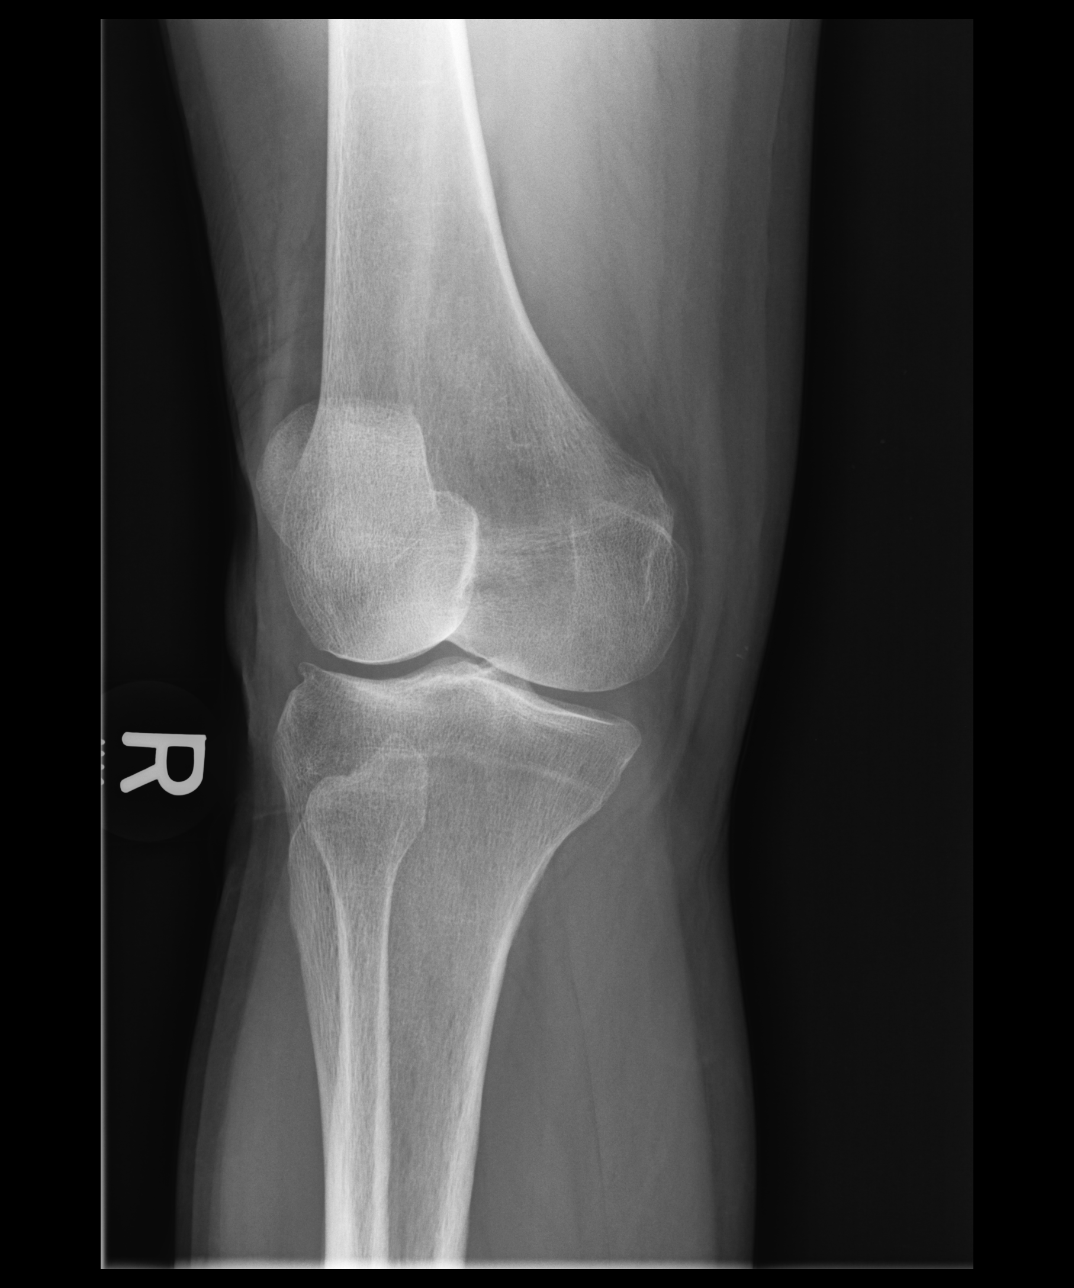

[dg knee complete 4 views right (4 of 4)]
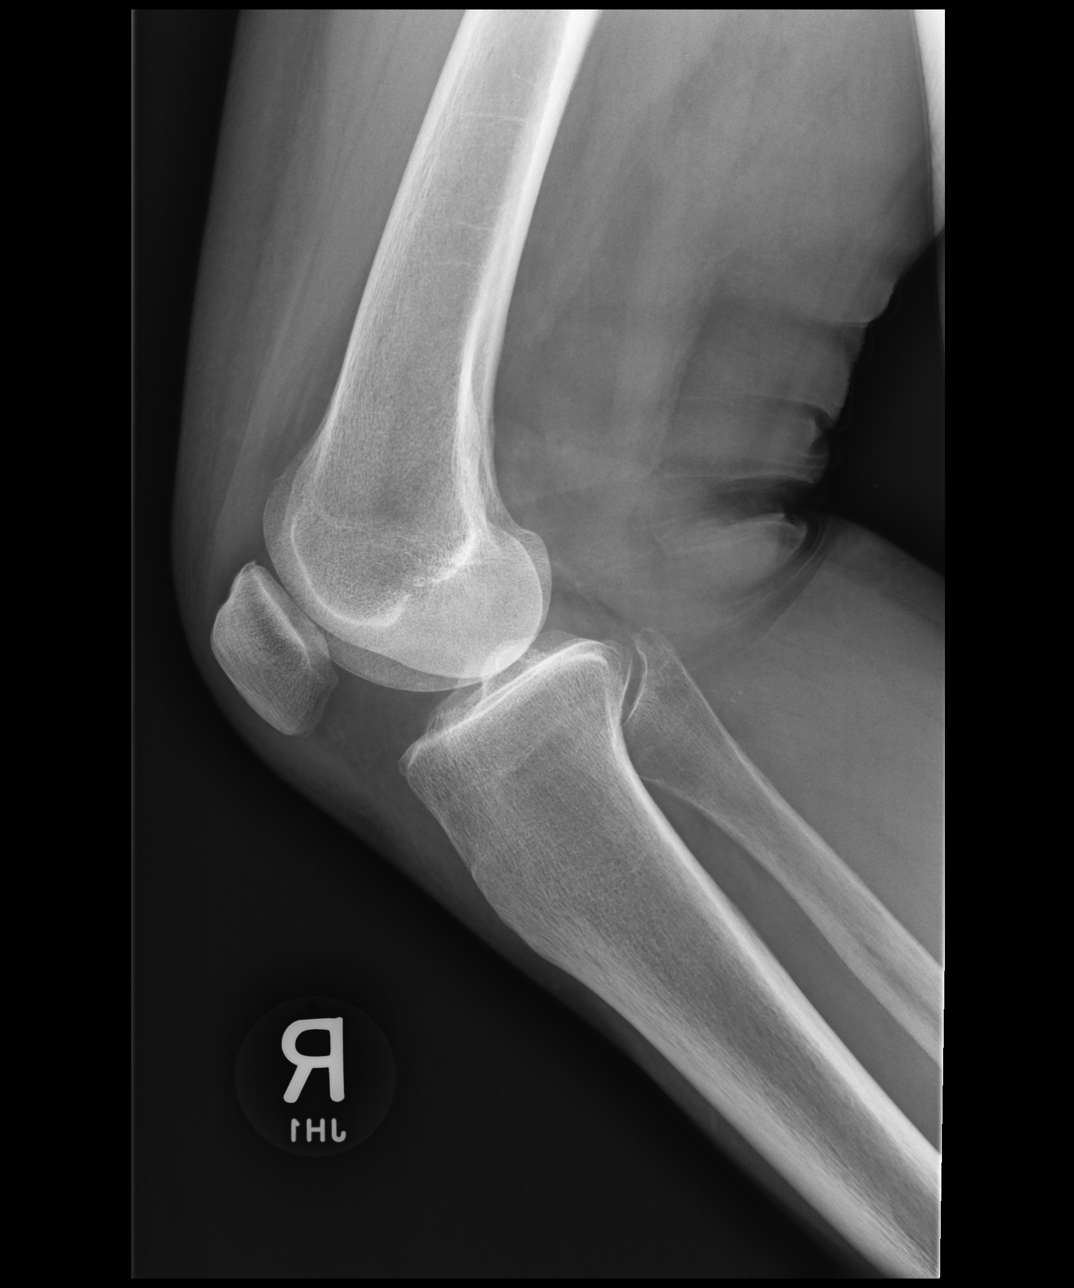

[4 of 4 positions shown; findings below may reference images not displayed]

FINDINGS: Four view radiograph right knee demonstrates normal alignment. No
fracture or dislocation. There is mild lateral compartment
degenerative arthritis with minimal osteophyte formation noted. No
effusion. Soft tissues are unremarkable.
IMPRESSION: Mild lateral compartment degenerative arthritis.

## 2022-10-27 DIAGNOSIS — Z0000 Encounter for general adult medical examination without abnormal findings: Secondary | ICD-10-CM | POA: Diagnosis not present

## 2022-10-27 DIAGNOSIS — Z1331 Encounter for screening for depression: Secondary | ICD-10-CM | POA: Diagnosis not present

## 2022-10-27 DIAGNOSIS — H9190 Unspecified hearing loss, unspecified ear: Secondary | ICD-10-CM | POA: Diagnosis not present

## 2022-10-27 DIAGNOSIS — Z1339 Encounter for screening examination for other mental health and behavioral disorders: Secondary | ICD-10-CM | POA: Diagnosis not present

## 2022-10-29 ENCOUNTER — Other Ambulatory Visit: Payer: Self-pay | Admitting: Family Medicine

## 2022-10-29 DIAGNOSIS — Z1231 Encounter for screening mammogram for malignant neoplasm of breast: Secondary | ICD-10-CM

## 2022-11-03 ENCOUNTER — Other Ambulatory Visit: Payer: Self-pay | Admitting: Family Medicine

## 2022-11-03 DIAGNOSIS — E2839 Other primary ovarian failure: Secondary | ICD-10-CM

## 2022-11-03 DIAGNOSIS — Z1231 Encounter for screening mammogram for malignant neoplasm of breast: Secondary | ICD-10-CM

## 2022-11-11 NOTE — Progress Notes (Signed)
[facility] - Clinic Note  11/25/2022   CHIEF COMPLAINT Patient presents for Retina Follow Up  HISTORY OF PRESENT ILLNESS: Amber Carpenter is a 77 y.o. female who presents to the clinic today for:  HPI     Retina Follow Up   Patient presents with  Other.  In both eyes.  This started 4 months ago.  I, the attending physician,  performed the HPI with the patient and updated documentation appropriately.        Comments   Patient here for 4 months retina follow up for ERM OU. Patient states vision doing fine. No eye pain. Uses systane BID OU.      Last edited by Rennis Chris, MD on 11/25/2022 12:47 PM.    Patient can tell a change in her vision / ?floater of the left eye when she's in bright light.    Referring physician: Lewis Moccasin, MD [address] [place],  [place] [address]  HISTORICAL INFORMATION:  Selected notes from the MEDICAL RECORD NUMBER Referred by Dr. Charlotte Sanes for ERM OS LEE:  Ocular Hx- PMH-   CURRENT MEDICATIONS: No current outpatient medications on file. (Ophthalmic Drugs)   No current facility-administered medications for this visit. (Ophthalmic Drugs)   Current Outpatient Medications (Other)  Medication Sig   amoxicillin (AMOXIL) 500 MG capsule Take 1 capsule (500 mg total) by mouth 2 (two) times daily.   azelastine (ASTELIN) 0.1 % nasal spray Place 1 spray into both nostrils 2 (two) times daily. Use in each nostril as directed   cetirizine (ZYRTEC) 10 MG tablet Take 1 tablet (10 mg total) by mouth daily.   diclofenac sodium (VOLTAREN) 1 % GEL Apply 1 application topically as needed.   glucosamine-chondroitin 500-400 MG tablet Take 1 tablet by mouth 3 (three) times daily.   levothyroxine (SYNTHROID) 75 MCG tablet Take 75 mcg by mouth daily before breakfast.   pseudoephedrine (SUDAFED) 60 MG tablet Take 1 tablet (60 mg total) by mouth every 12 (twelve) hours as needed for congestion.   rosuvastatin (CRESTOR) 5 MG tablet Take 5 mg  by mouth once a week.   fluticasone (FLONASE) 50 MCG/ACT nasal spray Place into both nostrils daily. (Patient not taking: Reported on 07/29/2022)   No current facility-administered medications for this visit. (Other)   REVIEW OF SYSTEMS: ROS   Positive for: Endocrine, Eyes Negative for: Constitutional, Gastrointestinal, Neurological, Skin, Genitourinary, Musculoskeletal, HENT, Cardiovascular, Respiratory, Psychiatric, Allergic/Imm, Heme/Lymph Last edited by Laddie Aquas, COA on 11/25/2022  9:32 AM.     ALLERGIES Allergies  Allergen Reactions   Epinephrine Palpitations   PAST MEDICAL HISTORY Past Medical History:  Diagnosis Date   Allergy    Heart murmur    Thyroid disease    History reviewed. No pertinent surgical history. FAMILY HISTORY Family History  Problem Relation Age of Onset   Glaucoma Mother    Heart disease Mother    Macular degeneration Father    Cancer Father    Heart disease Father    Hyperlipidemia Father    Stroke Father    SOCIAL HISTORY Social History   Tobacco Use   Smoking status: Never   Smokeless tobacco: Never  Vaping Use   Vaping status: Never Used  Substance Use Topics   Alcohol use: Yes    Comment: Occasionally   Drug use: No       OPHTHALMIC EXAM:  Base Eye Exam     Visual Acuity (Snellen - Linear)  Right Left   Dist [place] 20/20 20/20         Tonometry (Tonopen, 9:30 AM)       Right Left   Pressure 17 14         Pupils       Dark Light Shape React APD   Right 3 2 Round Brisk None   Left 3 2 Round Brisk None         Visual Fields (Counting fingers)       Left Right    Full Full         Extraocular Movement       Right Left    Full, Ortho Full, Ortho         Neuro/Psych     Oriented x3: Yes   Mood/Affect: Normal         Dilation     Both eyes: 1.0% Mydriacyl, 2.5% Phenylephrine @ 9:30 AM           Slit Lamp and Fundus Exam     Slit Lamp Exam       Right Left   Lids/Lashes  Dermatochalasis - upper lid Dermatochalasis - upper lid   Conjunctiva/Sclera White and quiet White and quiet   Cornea tear film debris, well healed cataract wound tear film debris, well healed cataract wound   Anterior Chamber deep and clear deep and clear   Iris Round and moderately dilated round and poorly dilated, Transillumination defects at 0200   Lens PC IOL in good position PC IOL in good position   Anterior Vitreous Vitreous syneresis, Posterior vitreous detachment Vitreous syneresis, Posterior vitreous detachment         Fundus Exam       Right Left   Disc Pink and Sharp Pink and Sharp, mild PPA   C/D Ratio 0.6 0.4   Macula Flat, Good foveal reflex, mild RPE mottling, mild ERM Flat, blunted foveal reflex, ERM with mild striae and central cystic changes, no heme   Vessels mild attenuation, mild tortuosity attenuated, mild tortuosity   Periphery Attached, scattered peripheral drusen, No heme Attached, old laser scars at 0130, operculated hole with laser scarring at 0430, scattered peripheral drusen, No heme, no new RT/RD           IMAGING AND PROCEDURES  Imaging and Procedures for 11/25/2022  OCT, Retina - OU - Both Eyes       Right Eye Quality was good. Central Foveal Thickness: 279. Progression has been stable. Findings include normal foveal contour, no IRF, no SRF, epiretinal membrane, macular pucker (Mild ERM with pucker- stable).   Left Eye Quality was good. Central Foveal Thickness: 442. Progression has improved. Findings include no SRF, abnormal foveal contour, epiretinal membrane, intraretinal fluid, macular pucker (ERM with pucker and central cystic changes / foveoschisis-- slightly improved).   Notes *Images captured and stored on drive  Diagnosis / Impression:  OD: mild ERM with pucker OS: ERM with pucker and central cystic changes / foveoschisis -- slightly improved  Clinical management:  See below  Abbreviations: NFP - Normal foveal profile. CME -  cystoid macular edema. PED - pigment epithelial detachment. IRF - intraretinal fluid. SRF - subretinal fluid. EZ - ellipsoid zone. ERM - epiretinal membrane. ORA - outer retinal atrophy. ORT - outer retinal tubulation. SRHM - subretinal hyper-reflective material. IRHM - intraretinal hyper-reflective material           ASSESSMENT/PLAN:   ICD-10-CM   1. Epiretinal membrane (ERM) of both eyes  H35.373 OCT, Retina - OU - Both Eyes    2. History of retinal tear  Z86.69     3. Posterior vitreous detachment of both eyes  H43.813     4. Pseudophakia, both eyes  Z96.1      Epiretinal membrane, both eyes (OS > OD) - The natural history, anatomy, potential for loss of vision, and treatment options including vitrectomy techniques and the complications of endophthalmitis, retinal detachment, vitreous hemorrhage, cataract progression and permanent vision loss discussed with the patient. - OD: very mild focal ERM superior macula - OS: ERM w/ pucker and cystic changes / foveoschisis -- slightly improved from prior - BCVA 20/20 OU - stable - pt reports minimal metamorphopsia OS only -- started ~October 2023 - no indication for surgery at this time - monitor for now - f/u 6 mos -- DFE/OCT  2. History of retinal tear OS  - repaired via laser retinopexy by Dr. Duke Salvia  - stable w/ good peripheral laser changes  - no new RT/RD  3. PVD / vitreous syneresis OU  - pt's main complaint is translucent floater OS in bright light only  - Discussed findings and prognosis  - No RT or RD on 360 peripheral exam  - Reviewed s/s of RT/RD  - Strict return precautions for any such RT/RD signs/symptoms  4. Pseudophakia OU  - s/p CE/IOL (Dr. Charlotte Sanes)  - IOL in good position, doing well  - monitor  Ophthalmic Meds Ordered this visit:  No orders of the defined types were placed in this encounter.    Return in about 6 months (around 05/28/2023) for f/u ERM OU, DFE, OCT.  There are no Patient Instructions on  file for this visit.  Explained the diagnoses, plan, and follow up with the patient and they expressed understanding.  Patient expressed understanding of the importance of proper follow up care.   This document serves as a record of services personally performed by Karie Chimera, MD, PhD. It was created on their behalf by Laurey Morale, COT an ophthalmic technician. The creation of this record is the provider's dictation and/or activities during the visit.    Electronically signed by:  Charlette Caffey, COT  11/25/22 12:48 PM  Karie Chimera, M.D., Ph.D. Diseases & Surgery of the Retina and Vitreous [facility]  I have reviewed the above documentation for accuracy and completeness, and I agree with the above. Karie Chimera, M.D., Ph.D. 11/25/22 12:50 PM   Abbreviations: M myopia (nearsighted); A astigmatism; H hyperopia (farsighted); P presbyopia; Mrx spectacle prescription;  CTL contact lenses; OD right eye; OS left eye; OU both eyes  XT exotropia; ET esotropia; PEK punctate epithelial keratitis; PEE punctate epithelial erosions; DES dry eye syndrome; MGD meibomian gland dysfunction; ATs artificial tears; PFAT's preservative free artificial tears; NSC nuclear sclerotic cataract; PSC posterior subcapsular cataract; ERM epi-retinal membrane; PVD posterior vitreous detachment; RD retinal detachment; DM diabetes mellitus; DR diabetic retinopathy; NPDR non-proliferative diabetic retinopathy; PDR proliferative diabetic retinopathy; CSME clinically significant macular edema; DME diabetic macular edema; dbh dot blot hemorrhages; CWS cotton wool spot; POAG primary open angle glaucoma; C/D cup-to-disc ratio; HVF humphrey visual field; GVF goldmann visual field; OCT optical coherence tomography; IOP intraocular pressure; BRVO Branch retinal vein occlusion; CRVO central retinal vein occlusion; CRAO central retinal artery occlusion; BRAO branch retinal artery occlusion; RT  retinal tear; SB scleral buckle; PPV pars plana vitrectomy; VH Vitreous hemorrhage; PRP panretinal laser photocoagulation; IVK intravitreal kenalog; VMT vitreomacular traction;  MH Macular hole;  NVD neovascularization of the disc; NVE neovascularization elsewhere; AREDS age related eye disease study; ARMD age related macular degeneration; POAG primary open angle glaucoma; EBMD epithelial/anterior basement membrane dystrophy; ACIOL anterior chamber intraocular lens; IOL intraocular lens; PCIOL posterior chamber intraocular lens; Phaco/IOL phacoemulsification with intraocular lens placement; PRK photorefractive keratectomy; LASIK laser assisted in situ keratomileusis; HTN hypertension; DM diabetes mellitus; COPD chronic obstructive pulmonary disease

## 2022-11-25 ENCOUNTER — Encounter (INDEPENDENT_AMBULATORY_CARE_PROVIDER_SITE_OTHER): Payer: Self-pay | Admitting: Ophthalmology

## 2022-11-25 ENCOUNTER — Ambulatory Visit (INDEPENDENT_AMBULATORY_CARE_PROVIDER_SITE_OTHER): Payer: Medicare HMO | Admitting: Ophthalmology

## 2022-11-25 DIAGNOSIS — Z8669 Personal history of other diseases of the nervous system and sense organs: Secondary | ICD-10-CM

## 2022-11-25 DIAGNOSIS — Z961 Presence of intraocular lens: Secondary | ICD-10-CM | POA: Diagnosis not present

## 2022-11-25 DIAGNOSIS — H43813 Vitreous degeneration, bilateral: Secondary | ICD-10-CM | POA: Diagnosis not present

## 2022-11-25 DIAGNOSIS — H35373 Puckering of macula, bilateral: Secondary | ICD-10-CM | POA: Diagnosis not present

## 2022-12-19 DIAGNOSIS — U071 COVID-19: Secondary | ICD-10-CM | POA: Diagnosis not present

## 2022-12-19 DIAGNOSIS — Z1159 Encounter for screening for other viral diseases: Secondary | ICD-10-CM | POA: Diagnosis not present

## 2022-12-19 DIAGNOSIS — J069 Acute upper respiratory infection, unspecified: Secondary | ICD-10-CM | POA: Diagnosis not present

## 2023-01-08 DIAGNOSIS — B351 Tinea unguium: Secondary | ICD-10-CM | POA: Diagnosis not present

## 2023-01-08 DIAGNOSIS — B359 Dermatophytosis, unspecified: Secondary | ICD-10-CM | POA: Diagnosis not present

## 2023-01-08 DIAGNOSIS — B353 Tinea pedis: Secondary | ICD-10-CM | POA: Diagnosis not present

## 2023-02-05 ENCOUNTER — Ambulatory Visit: Payer: Medicare HMO | Admitting: Audiologist

## 2023-02-11 DIAGNOSIS — B353 Tinea pedis: Secondary | ICD-10-CM | POA: Diagnosis not present

## 2023-02-18 DIAGNOSIS — N390 Urinary tract infection, site not specified: Secondary | ICD-10-CM | POA: Diagnosis not present

## 2023-02-18 DIAGNOSIS — M545 Low back pain, unspecified: Secondary | ICD-10-CM | POA: Diagnosis not present

## 2023-02-18 DIAGNOSIS — N819 Female genital prolapse, unspecified: Secondary | ICD-10-CM | POA: Diagnosis not present

## 2023-02-19 ENCOUNTER — Encounter: Payer: Self-pay | Admitting: Family Medicine

## 2023-02-19 ENCOUNTER — Other Ambulatory Visit: Payer: Self-pay | Admitting: Family Medicine

## 2023-02-19 ENCOUNTER — Ambulatory Visit
Admission: RE | Admit: 2023-02-19 | Discharge: 2023-02-19 | Disposition: A | Discharge status: Home / Self Care | Payer: Medicare HMO | Source: Ambulatory Visit | Attending: Family Medicine | Admitting: Family Medicine

## 2023-02-19 DIAGNOSIS — R102 Pelvic and perineal pain: Secondary | ICD-10-CM

## 2023-02-19 DIAGNOSIS — N390 Urinary tract infection, site not specified: Secondary | ICD-10-CM | POA: Diagnosis not present

## 2023-02-19 DIAGNOSIS — K5730 Diverticulosis of large intestine without perforation or abscess without bleeding: Secondary | ICD-10-CM | POA: Diagnosis not present

## 2023-02-19 DIAGNOSIS — M545 Low back pain, unspecified: Secondary | ICD-10-CM | POA: Diagnosis not present

## 2023-02-19 DIAGNOSIS — I700 Atherosclerosis of aorta: Secondary | ICD-10-CM | POA: Diagnosis not present

## 2023-02-19 MED ORDER — IOPAMIDOL (ISOVUE-300) INJECTION 61%
500.0000 mL | Freq: Once | INTRAVENOUS | Status: AC | PRN
  Administered 2023-02-19: 100 mL via INTRAVENOUS

## 2023-02-20 DIAGNOSIS — N390 Urinary tract infection, site not specified: Secondary | ICD-10-CM | POA: Diagnosis not present

## 2023-05-13 NOTE — Progress Notes (Signed)
 [facility] - Clinic Note  05/26/2023   CHIEF COMPLAINT Patient presents for Retina Follow Up  HISTORY OF PRESENT ILLNESS: Amber Carpenter is a 78 y.o. female who presents to the clinic today for:  HPI     Retina Follow Up   Patient presents with  Other.  In both eyes.  This started 6 months ago.  Duration of 6 months.  I, the attending physician,  performed the HPI with the patient and updated documentation appropriately.        Comments   Patient feels the vision may be a little worse. She is using AT's      Last edited by Rennis Chris, MD on 05/26/2023 12:52 PM.    Patient states vision may be slightly worse.   Referring physician: Lewis Moccasin, MD [address] [place],  [place] [address]  HISTORICAL INFORMATION:  Selected notes from the MEDICAL RECORD NUMBER Referred by Dr. Charlotte Sanes for ERM OS LEE:  Ocular Hx- PMH-   CURRENT MEDICATIONS: No current outpatient medications on file. (Ophthalmic Drugs)   No current facility-administered medications for this visit. (Ophthalmic Drugs)   Current Outpatient Medications (Other)  Medication Sig   amoxicillin (AMOXIL) 500 MG capsule Take 1 capsule (500 mg total) by mouth 2 (two) times daily.   azelastine (ASTELIN) 0.1 % nasal spray Place 1 spray into both nostrils 2 (two) times daily. Use in each nostril as directed   cetirizine (ZYRTEC) 10 MG tablet Take 1 tablet (10 mg total) by mouth daily.   diclofenac sodium (VOLTAREN) 1 % GEL Apply 1 application topically as needed.   fluticasone (FLONASE) 50 MCG/ACT nasal spray Place into both nostrils daily. (Patient not taking: Reported on 07/29/2022)   glucosamine-chondroitin 500-400 MG tablet Take 1 tablet by mouth 3 (three) times daily.   levothyroxine (SYNTHROID) 75 MCG tablet Take 75 mcg by mouth daily before breakfast.   pseudoephedrine (SUDAFED) 60 MG tablet Take 1 tablet (60 mg total) by mouth every 12 (twelve) hours as needed for congestion.    rosuvastatin (CRESTOR) 5 MG tablet Take 5 mg by mouth once a week.   No current facility-administered medications for this visit. (Other)   REVIEW OF SYSTEMS: ROS   Positive for: Endocrine, Eyes Negative for: Constitutional, Gastrointestinal, Neurological, Skin, Genitourinary, Musculoskeletal, HENT, Cardiovascular, Respiratory, Psychiatric, Allergic/Imm, Heme/Lymph Last edited by Charlette Caffey, COT on 05/26/2023  9:21 AM.     ALLERGIES Allergies  Allergen Reactions   Epinephrine Palpitations   PAST MEDICAL HISTORY Past Medical History:  Diagnosis Date   Allergy    Heart murmur    Thyroid disease    History reviewed. No pertinent surgical history. FAMILY HISTORY Family History  Problem Relation Age of Onset   Glaucoma Mother    Heart disease Mother    Macular degeneration Father    Cancer Father    Heart disease Father    Hyperlipidemia Father    Stroke Father    SOCIAL HISTORY Social History   Tobacco Use   Smoking status: Never   Smokeless tobacco: Never  Vaping Use   Vaping status: Never Used  Substance Use Topics   Alcohol use: Yes    Comment: Occasionally   Drug use: No       OPHTHALMIC EXAM:  Base Eye Exam     Visual Acuity (Snellen - Linear)       Right Left   Dist [place] 20/25 20/25   Dist ph [place] 20/20 -2 NI  Tonometry (Tonopen, 9:24 AM)       Right Left   Pressure 15 13         Pupils       Dark Light Shape React APD   Right 3 2 Round Brisk None   Left 3 2 Round Brisk None         Visual Fields       Left Right    Full Full         Extraocular Movement       Right Left    Full, Ortho Full, Ortho         Neuro/Psych     Oriented x3: Yes   Mood/Affect: Normal         Dilation     Both eyes: 1.0% Mydriacyl, 2.5% Phenylephrine @ 9:22 AM           Slit Lamp and Fundus Exam     Slit Lamp Exam       Right Left   Lids/Lashes Dermatochalasis - upper lid Dermatochalasis - upper lid    Conjunctiva/Sclera White and quiet nasal pinguecula   Cornea tear film debris, well healed cataract wound tear film debris, well healed cataract wound, trace PEE   Anterior Chamber deep and clear deep and clear   Iris Round and moderately dilated round and poorly dilated, Transillumination defects at 0200 and 0800   Lens PC IOL in good position PC IOL in good position   Anterior Vitreous Vitreous syneresis, Posterior vitreous detachment Vitreous syneresis, Posterior vitreous detachment         Fundus Exam       Right Left   Disc Pink and Sharp Pink and Sharp, mild PPA   C/D Ratio 0.6 0.4   Macula Flat, Good foveal reflex, mild RPE mottling, mild ERM Flat, blunted foveal reflex, ERM with mild striae and central cystic changes, no heme   Vessels mild attenuation, mild tortuosity attenuated, mild tortuosity   Periphery Attached, scattered peripheral drusen, No heme Attached, old laser scars at 0130, operculated hole with laser scarring at 0430, scattered peripheral drusen, No heme, no new RT/RD           IMAGING AND PROCEDURES  Imaging and Procedures for 05/26/2023  OCT, Retina - OU - Both Eyes       Right Eye Quality was good. Central Foveal Thickness: 272. Progression has been stable. Findings include normal foveal contour, no IRF, no SRF, epiretinal membrane, macular pucker (Mild ERM with pucker- stable).   Left Eye Quality was good. Central Foveal Thickness: 428. Progression has improved. Findings include no SRF, abnormal foveal contour, epiretinal membrane, intraretinal fluid, macular pucker (ERM with pucker and central cystic changes / foveoschisis-- slightly improved).   Notes *Images captured and stored on drive  Diagnosis / Impression:  OD: mild ERM with pucker--stable OS: ERM with pucker and central cystic changes / foveoschisis -- slightly improved  Clinical management:  See below  Abbreviations: NFP - Normal foveal profile. CME - cystoid macular edema. PED -  pigment epithelial detachment. IRF - intraretinal fluid. SRF - subretinal fluid. EZ - ellipsoid zone. ERM - epiretinal membrane. ORA - outer retinal atrophy. ORT - outer retinal tubulation. SRHM - subretinal hyper-reflective material. IRHM - intraretinal hyper-reflective material           ASSESSMENT/PLAN:   ICD-10-CM   1. Epiretinal membrane (ERM) of both eyes  H35.373 OCT, Retina - OU - Both Eyes    2. History of retinal  tear  Z86.69     3. Posterior vitreous detachment of both eyes  H43.813     4. Pseudophakia, both eyes  Z96.1      Epiretinal membrane, both eyes (OS > OD) - The natural history, anatomy, potential for loss of vision, and treatment options including vitrectomy techniques and the complications of endophthalmitis, retinal detachment, vitreous hemorrhage, cataract progression and permanent vision loss discussed with the patient. - OD: very mild focal ERM superior macula - OS: ERM w/ pucker and cystic changes / foveoschisis -- slightly improved from prior - BCVA OD 20/20--stable; OS 20/25 (slightly decreased from 20/20) - pt reports minimal metamorphopsia OS only -- started ~October 2023 - no indication for surgery at this time - monitor for now - f/u 6 mos -- DFE/OCT  2. History of retinal tear OS  - repaired via laser retinopexy by Dr. Duke Salvia  - stable w/ good peripheral laser changes  - no new RT/RD  3. PVD / vitreous syneresis OU  - pt's main complaint is translucent floater OS in bright light only  - Discussed findings and prognosis  - No RT or RD on 360 peripheral exam  - Reviewed s/s of RT/RD  - Strict return precautions for any such RT/RD signs/symptoms  4. Pseudophakia OU  - s/p CE/IOL (Dr. Charlotte Sanes)  - IOL in good position, doing well  - monitor  Ophthalmic Meds Ordered this visit:  No orders of the defined types were placed in this encounter.    Return in about 6 months (around 11/23/2023) for ERM OU - DFE, OCT.  There are no Patient  Instructions on file for this visit.  Explained the diagnoses, plan, and follow up with the patient and they expressed understanding.  Patient expressed understanding of the importance of proper follow up care.   This document serves as a record of services personally performed by Karie Chimera, MD, PhD. It was created on their behalf by Laurey Morale, COT an ophthalmic technician. The creation of this record is the provider's dictation and/or activities during the visit.    Electronically signed by:  Charlette Caffey, COT  05/26/23 12:53 PM  This document serves as a record of services personally performed by Karie Chimera, MD, PhD. It was created on their behalf by Annalee Genta, COMT. The creation of this record is the provider's dictation and/or activities during the visit.  Electronically signed by: Annalee Genta, COMT 05/26/23 12:53 PM  Karie Chimera, M.D., Ph.D. Diseases & Surgery of the Retina and Vitreous [facility]  I have reviewed the above documentation for accuracy and completeness, and I agree with the above. Karie Chimera, M.D., Ph.D. 05/26/23 12:55 PM   Abbreviations: M myopia (nearsighted); A astigmatism; H hyperopia (farsighted); P presbyopia; Mrx spectacle prescription;  CTL contact lenses; OD right eye; OS left eye; OU both eyes  XT exotropia; ET esotropia; PEK punctate epithelial keratitis; PEE punctate epithelial erosions; DES dry eye syndrome; MGD meibomian gland dysfunction; ATs artificial tears; PFAT's preservative free artificial tears; NSC nuclear sclerotic cataract; PSC posterior subcapsular cataract; ERM epi-retinal membrane; PVD posterior vitreous detachment; RD retinal detachment; DM diabetes mellitus; DR diabetic retinopathy; NPDR non-proliferative diabetic retinopathy; PDR proliferative diabetic retinopathy; CSME clinically significant macular edema; DME diabetic macular edema; dbh dot blot hemorrhages; CWS cotton wool spot; POAG  primary open angle glaucoma; C/D cup-to-disc ratio; HVF humphrey visual field; GVF goldmann visual field; OCT optical coherence tomography; IOP intraocular pressure; BRVO Branch retinal  vein occlusion; CRVO central retinal vein occlusion; CRAO central retinal artery occlusion; BRAO branch retinal artery occlusion; RT retinal tear; SB scleral buckle; PPV pars plana vitrectomy; VH Vitreous hemorrhage; PRP panretinal laser photocoagulation; IVK intravitreal kenalog; VMT vitreomacular traction; MH Macular hole;  NVD neovascularization of the disc; NVE neovascularization elsewhere; AREDS age related eye disease study; ARMD age related macular degeneration; POAG primary open angle glaucoma; EBMD epithelial/anterior basement membrane dystrophy; ACIOL anterior chamber intraocular lens; IOL intraocular lens; PCIOL posterior chamber intraocular lens; Phaco/IOL phacoemulsification with intraocular lens placement; PRK photorefractive keratectomy; LASIK laser assisted in situ keratomileusis; HTN hypertension; DM diabetes mellitus; COPD chronic obstructive pulmonary disease

## 2023-05-26 ENCOUNTER — Encounter (INDEPENDENT_AMBULATORY_CARE_PROVIDER_SITE_OTHER): Payer: Self-pay | Admitting: Ophthalmology

## 2023-05-26 ENCOUNTER — Ambulatory Visit (INDEPENDENT_AMBULATORY_CARE_PROVIDER_SITE_OTHER): Payer: Medicare HMO | Admitting: Ophthalmology

## 2023-05-26 DIAGNOSIS — Z961 Presence of intraocular lens: Secondary | ICD-10-CM | POA: Diagnosis not present

## 2023-05-26 DIAGNOSIS — H35373 Puckering of macula, bilateral: Secondary | ICD-10-CM | POA: Diagnosis not present

## 2023-05-26 DIAGNOSIS — Z8669 Personal history of other diseases of the nervous system and sense organs: Secondary | ICD-10-CM | POA: Diagnosis not present

## 2023-05-26 DIAGNOSIS — H43813 Vitreous degeneration, bilateral: Secondary | ICD-10-CM | POA: Diagnosis not present

## 2023-06-15 DIAGNOSIS — R5383 Other fatigue: Secondary | ICD-10-CM | POA: Diagnosis not present

## 2023-06-15 DIAGNOSIS — E559 Vitamin D deficiency, unspecified: Secondary | ICD-10-CM | POA: Diagnosis not present

## 2023-06-26 DIAGNOSIS — L853 Xerosis cutis: Secondary | ICD-10-CM | POA: Diagnosis not present

## 2023-06-26 DIAGNOSIS — J309 Allergic rhinitis, unspecified: Secondary | ICD-10-CM | POA: Diagnosis not present

## 2023-06-26 DIAGNOSIS — E782 Mixed hyperlipidemia: Secondary | ICD-10-CM | POA: Diagnosis not present

## 2023-06-26 DIAGNOSIS — E039 Hypothyroidism, unspecified: Secondary | ICD-10-CM | POA: Diagnosis not present

## 2023-06-26 DIAGNOSIS — E559 Vitamin D deficiency, unspecified: Secondary | ICD-10-CM | POA: Diagnosis not present

## 2023-06-26 DIAGNOSIS — Z789 Other specified health status: Secondary | ICD-10-CM | POA: Diagnosis not present

## 2023-07-22 DIAGNOSIS — H35373 Puckering of macula, bilateral: Secondary | ICD-10-CM | POA: Diagnosis not present

## 2023-07-22 DIAGNOSIS — Z961 Presence of intraocular lens: Secondary | ICD-10-CM | POA: Diagnosis not present

## 2023-07-22 DIAGNOSIS — H40013 Open angle with borderline findings, low risk, bilateral: Secondary | ICD-10-CM | POA: Diagnosis not present

## 2023-08-12 DIAGNOSIS — Z0000 Encounter for general adult medical examination without abnormal findings: Secondary | ICD-10-CM | POA: Diagnosis not present

## 2023-08-12 DIAGNOSIS — Z13220 Encounter for screening for lipoid disorders: Secondary | ICD-10-CM | POA: Diagnosis not present

## 2023-08-18 DIAGNOSIS — R21 Rash and other nonspecific skin eruption: Secondary | ICD-10-CM | POA: Diagnosis not present

## 2023-08-18 DIAGNOSIS — Z0000 Encounter for general adult medical examination without abnormal findings: Secondary | ICD-10-CM | POA: Diagnosis not present

## 2023-09-01 DIAGNOSIS — F331 Major depressive disorder, recurrent, moderate: Secondary | ICD-10-CM | POA: Diagnosis not present

## 2023-09-01 DIAGNOSIS — R21 Rash and other nonspecific skin eruption: Secondary | ICD-10-CM | POA: Diagnosis not present

## 2023-09-01 DIAGNOSIS — F411 Generalized anxiety disorder: Secondary | ICD-10-CM | POA: Diagnosis not present

## 2023-09-01 DIAGNOSIS — G4700 Insomnia, unspecified: Secondary | ICD-10-CM | POA: Diagnosis not present

## 2023-11-10 DIAGNOSIS — R5383 Other fatigue: Secondary | ICD-10-CM | POA: Diagnosis not present

## 2023-11-10 DIAGNOSIS — E785 Hyperlipidemia, unspecified: Secondary | ICD-10-CM | POA: Diagnosis not present

## 2023-11-23 NOTE — Progress Notes (Signed)
 [facility] - Clinic Note  11/24/2023   CHIEF COMPLAINT Patient presents for Retina Follow Up  HISTORY OF PRESENT ILLNESS: Amber Carpenter is a 78 y.o. female who presents to the clinic today for:  HPI     Retina Follow Up   Diagnosis: Epiretinal membrane.  In both eyes.  This started 1.5 years ago.  Duration of 6 months.  Since onset it is gradually improving.  I, the attending physician,  performed the HPI with the patient and updated documentation appropriately.        Comments   Pt states she hasn't noticed any changes in vision but vision seems better right after yoga. Pt denies FOL/floaters/pain. Pt is using ats bid OU.      Last edited by Valdemar Rogue, MD on 11/24/2023 10:35 PM.     Patient states VA is great, happy with it. Pt feels VA is better reading after doing yoga.   Referring physician: Waylan Almarie SAUNDERS, MD [address] [place],  [place] [address]  HISTORICAL INFORMATION:  Selected notes from the MEDICAL RECORD NUMBER Referred by Dr. McCuen for ERM OS LEE:  Ocular Hx- PMH-   CURRENT MEDICATIONS: No current outpatient medications on file. (Ophthalmic Drugs)   No current facility-administered medications for this visit. (Ophthalmic Drugs)   Current Outpatient Medications (Other)  Medication Sig   rosuvastatin (CRESTOR) 5 MG tablet Take 5 mg by mouth once a week. (Patient taking differently: Take 5 mg by mouth 2 (two) times a week.)   amoxicillin  (AMOXIL ) 500 MG capsule Take 1 capsule (500 mg total) by mouth 2 (two) times daily.   azelastine (ASTELIN) 0.1 % nasal spray Place 1 spray into both nostrils 2 (two) times daily. Use in each nostril as directed   cetirizine  (ZYRTEC ) 10 MG tablet Take 1 tablet (10 mg total) by mouth daily.   diclofenac sodium (VOLTAREN) 1 % GEL Apply 1 application topically as needed.   fluticasone (FLONASE) 50 MCG/ACT nasal spray Place into both nostrils daily. (Patient not taking: Reported on 07/29/2022)    glucosamine-chondroitin 500-400 MG tablet Take 1 tablet by mouth 3 (three) times daily.   levothyroxine (SYNTHROID) 75 MCG tablet Take 75 mcg by mouth daily before breakfast.   pseudoephedrine  (SUDAFED) 60 MG tablet Take 1 tablet (60 mg total) by mouth every 12 (twelve) hours as needed for congestion.   No current facility-administered medications for this visit. (Other)   REVIEW OF SYSTEMS: ROS   Positive for: Endocrine, Eyes Negative for: Constitutional, Gastrointestinal, Neurological, Skin, Genitourinary, Musculoskeletal, HENT, Cardiovascular, Respiratory, Psychiatric, Allergic/Imm, Heme/Lymph Last edited by Elnor Avelina RAMAN, COT on 11/24/2023  9:43 AM.      ALLERGIES Allergies  Allergen Reactions   Epinephrine Palpitations   PAST MEDICAL HISTORY Past Medical History:  Diagnosis Date   Allergy    Heart murmur    Thyroid  disease    History reviewed. No pertinent surgical history. FAMILY HISTORY Family History  Problem Relation Age of Onset   Glaucoma Mother    Heart disease Mother    Macular degeneration Father    Cancer Father    Heart disease Father    Hyperlipidemia Father    Stroke Father    SOCIAL HISTORY Social History   Tobacco Use   Smoking status: Never   Smokeless tobacco: Never  Vaping Use   Vaping status: Never Used  Substance Use Topics   Alcohol  use: Yes    Comment: Occasionally   Drug use: No  OPHTHALMIC EXAM:  Base Eye Exam     Visual Acuity (Snellen - Linear)       Right Left   Dist [place] 20/20 -1 20/20 -1         Tonometry (Tonopen, 9:50 AM)       Right Left   Pressure 18 14         Pupils       Pupils Dark Light Shape React APD   Right PERRL 3 2 Round Brisk None   Left PERRL 3 2 Round Brisk None         Visual Fields       Left Right    Full Full         Extraocular Movement       Right Left    Full, Ortho Full, Ortho         Neuro/Psych     Oriented x3: Yes   Mood/Affect: Normal          Dilation     Both eyes: 1.0% Mydriacyl, 2.5% Phenylephrine @ 9:51 AM           Slit Lamp and Fundus Exam     Slit Lamp Exam       Right Left   Lids/Lashes Dermatochalasis - upper lid Dermatochalasis - upper lid   Conjunctiva/Sclera White and quiet nasal pinguecula   Cornea tear film debris, well healed cataract wound tear film debris, well healed cataract wound, 1+ PEE   Anterior Chamber deep and clear deep and clear   Iris Round and moderately dilated round and poorly dilated, Transillumination defects at 0200 and 0800   Lens PC IOL in good position PC IOL in good position   Anterior Vitreous Vitreous syneresis, Posterior vitreous detachment Vitreous syneresis, Posterior vitreous detachment         Fundus Exam       Right Left   Disc Pink and Sharp Pink and Sharp, mild PPA   C/D Ratio 0.6 0.4   Macula Flat, Good foveal reflex, mild RPE mottling, mild ERM Flat, blunted foveal reflex, ERM with mild striae and central cystic changes, no heme   Vessels mild attenuation, mild tortuosity attenuated, mild tortuosity   Periphery Attached, scattered peripheral drusen, No heme Attached, old laser scars at 0130, operculated hole with laser scarring at 0430, scattered peripheral drusen, No heme, no new RT/RD           IMAGING AND PROCEDURES  Imaging and Procedures for 11/24/2023  OCT, Retina - OU - Both Eyes       Right Eye Quality was good. Central Foveal Thickness: 271. Progression has been stable. Findings include normal foveal contour, no IRF, no SRF, epiretinal membrane, macular pucker (Mild ERM with pucker- stable).   Left Eye Quality was good. Central Foveal Thickness: 403. Progression has been stable. Findings include no SRF, abnormal foveal contour, epiretinal membrane, intraretinal fluid, macular pucker (ERM with pucker and central cystic changes / foveoschisis).   Notes *Images captured and stored on drive  Diagnosis / Impression:  OD: mild ERM with  pucker--stable OS: ERM with pucker and central cystic changes / foveoschisis  Clinical management:  See below  Abbreviations: NFP - Normal foveal profile. CME - cystoid macular edema. PED - pigment epithelial detachment. IRF - intraretinal fluid. SRF - subretinal fluid. EZ - ellipsoid zone. ERM - epiretinal membrane. ORA - outer retinal atrophy. ORT - outer retinal tubulation. SRHM - subretinal hyper-reflective material. IRHM - intraretinal hyper-reflective material  ASSESSMENT/PLAN:   ICD-10-CM   1. Epiretinal membrane (ERM) of both eyes  H35.373 OCT, Retina - OU - Both Eyes    2. History of retinal tear  Z86.69     3. Posterior vitreous detachment of both eyes  H43.813     4. Pseudophakia, both eyes  Z96.1       Epiretinal membrane, both eyes (OS > OD) - The natural history, anatomy, potential for loss of vision, and treatment options including vitrectomy techniques and the complications of endophthalmitis, retinal detachment, vitreous hemorrhage, cataract progression and permanent vision loss discussed with the patient. - OD: very mild focal ERM superior macula - OS: ERM w/ pucker and cystic changes / foveoschisis - BCVA OU 20/20--stable - pt reports minimal metamorphopsia OS only -- started ~October 2023 - no indication for surgery at this time - monitor for now - f/u 9-12 mos -- DFE/OCT  2. History of retinal tear OS  - repaired via laser retinopexy by Dr. Raford  - stable w/ good peripheral laser changes  - no new RT/RD  3. PVD / vitreous syneresis OU  - pt's main complaint is translucent floater OS in bright light only  - Discussed findings and prognosis  - No RT or RD on 360 peripheral exam  - Reviewed s/s of RT/RD  - Strict return precautions for any such RT/RD symptoms  4. Pseudophakia OU  - s/p CE/IOL (Dr. Leslee)  - IOL in good position, doing well  - monitor  Ophthalmic Meds Ordered this visit:  No orders of the defined types were placed  in this encounter.    Return for 9-64mo ERM OU, DFE, OCT.  There are no Patient Instructions on file for this visit.  Explained the diagnoses, plan, and follow up with the patient and they expressed understanding.  Patient expressed understanding of the importance of proper follow up care.   This document serves as a record of services personally performed by Redell JUDITHANN Hans, MD, PhD. It was created on their behalf by Auston Muzzy, COMT. The creation of this record is the provider's dictation and/or activities during the visit.  Electronically signed by: Auston Muzzy, COMT 11/24/23 10:35 PM  Redell JUDITHANN Hans, M.D., Ph.D. Diseases & Surgery of the Retina and Vitreous [facility]   Abbreviations: M myopia (nearsighted); A astigmatism; H hyperopia (farsighted); P presbyopia; Mrx spectacle prescription;  CTL contact lenses; OD right eye; OS left eye; OU both eyes  XT exotropia; ET esotropia; PEK punctate epithelial keratitis; PEE punctate epithelial erosions; DES dry eye syndrome; MGD meibomian gland dysfunction; ATs artificial tears; PFAT's preservative free artificial tears; NSC nuclear sclerotic cataract; PSC posterior subcapsular cataract; ERM epi-retinal membrane; PVD posterior vitreous detachment; RD retinal detachment; DM diabetes mellitus; DR diabetic retinopathy; NPDR non-proliferative diabetic retinopathy; PDR proliferative diabetic retinopathy; CSME clinically significant macular edema; DME diabetic macular edema; dbh dot blot hemorrhages; CWS cotton wool spot; POAG primary open angle glaucoma; C/D cup-to-disc ratio; HVF humphrey visual field; GVF goldmann visual field; OCT optical coherence tomography; IOP intraocular pressure; BRVO Branch retinal vein occlusion; CRVO central retinal vein occlusion; CRAO central retinal artery occlusion; BRAO branch retinal artery occlusion; RT retinal tear; SB scleral buckle; PPV pars plana vitrectomy; VH Vitreous hemorrhage; PRP  panretinal laser photocoagulation; IVK intravitreal kenalog ; VMT vitreomacular traction; MH Macular hole;  NVD neovascularization of the disc; NVE neovascularization elsewhere; AREDS age related eye disease study; ARMD age related macular degeneration; POAG primary open angle glaucoma; EBMD epithelial/anterior basement  membrane dystrophy; ACIOL anterior chamber intraocular lens; IOL intraocular lens; PCIOL posterior chamber intraocular lens; Phaco/IOL phacoemulsification with intraocular lens placement; PRK photorefractive keratectomy; LASIK laser assisted in situ keratomileusis; HTN hypertension; DM diabetes mellitus; COPD chronic obstructive pulmonary disease

## 2023-11-24 ENCOUNTER — Encounter (INDEPENDENT_AMBULATORY_CARE_PROVIDER_SITE_OTHER): Payer: Self-pay | Admitting: Ophthalmology

## 2023-11-24 ENCOUNTER — Ambulatory Visit (INDEPENDENT_AMBULATORY_CARE_PROVIDER_SITE_OTHER): Payer: Medicare HMO | Admitting: Ophthalmology

## 2023-11-24 DIAGNOSIS — Z8669 Personal history of other diseases of the nervous system and sense organs: Secondary | ICD-10-CM | POA: Diagnosis not present

## 2023-11-24 DIAGNOSIS — Z961 Presence of intraocular lens: Secondary | ICD-10-CM

## 2023-11-24 DIAGNOSIS — H35373 Puckering of macula, bilateral: Secondary | ICD-10-CM

## 2023-11-24 DIAGNOSIS — H43813 Vitreous degeneration, bilateral: Secondary | ICD-10-CM | POA: Diagnosis not present

## 2023-11-26 DIAGNOSIS — Z23 Encounter for immunization: Secondary | ICD-10-CM | POA: Diagnosis not present

## 2023-11-26 DIAGNOSIS — Z1331 Encounter for screening for depression: Secondary | ICD-10-CM | POA: Diagnosis not present

## 2023-11-26 DIAGNOSIS — L309 Dermatitis, unspecified: Secondary | ICD-10-CM | POA: Diagnosis not present

## 2023-11-26 DIAGNOSIS — G4700 Insomnia, unspecified: Secondary | ICD-10-CM | POA: Diagnosis not present

## 2023-11-26 DIAGNOSIS — F411 Generalized anxiety disorder: Secondary | ICD-10-CM | POA: Diagnosis not present

## 2023-11-26 DIAGNOSIS — E782 Mixed hyperlipidemia: Secondary | ICD-10-CM | POA: Diagnosis not present

## 2023-11-26 DIAGNOSIS — J309 Allergic rhinitis, unspecified: Secondary | ICD-10-CM | POA: Diagnosis not present

## 2023-11-26 DIAGNOSIS — Z1339 Encounter for screening examination for other mental health and behavioral disorders: Secondary | ICD-10-CM | POA: Diagnosis not present

## 2023-11-26 DIAGNOSIS — Z0000 Encounter for general adult medical examination without abnormal findings: Secondary | ICD-10-CM | POA: Diagnosis not present

## 2024-01-12 DIAGNOSIS — M810 Age-related osteoporosis without current pathological fracture: Secondary | ICD-10-CM | POA: Diagnosis not present

## 2024-01-18 DIAGNOSIS — E782 Mixed hyperlipidemia: Secondary | ICD-10-CM | POA: Diagnosis not present

## 2024-01-18 DIAGNOSIS — R5383 Other fatigue: Secondary | ICD-10-CM | POA: Diagnosis not present

## 2024-01-18 DIAGNOSIS — E559 Vitamin D deficiency, unspecified: Secondary | ICD-10-CM | POA: Diagnosis not present

## 2024-01-21 DIAGNOSIS — M810 Age-related osteoporosis without current pathological fracture: Secondary | ICD-10-CM | POA: Diagnosis not present

## 2024-01-21 DIAGNOSIS — F331 Major depressive disorder, recurrent, moderate: Secondary | ICD-10-CM | POA: Diagnosis not present

## 2024-01-21 DIAGNOSIS — Z789 Other specified health status: Secondary | ICD-10-CM | POA: Diagnosis not present

## 2024-01-21 DIAGNOSIS — E782 Mixed hyperlipidemia: Secondary | ICD-10-CM | POA: Diagnosis not present

## 2024-01-21 DIAGNOSIS — G4700 Insomnia, unspecified: Secondary | ICD-10-CM | POA: Diagnosis not present

## 2024-01-21 DIAGNOSIS — Z23 Encounter for immunization: Secondary | ICD-10-CM | POA: Diagnosis not present

## 2024-01-21 DIAGNOSIS — F411 Generalized anxiety disorder: Secondary | ICD-10-CM | POA: Diagnosis not present

## 2024-01-21 DIAGNOSIS — E559 Vitamin D deficiency, unspecified: Secondary | ICD-10-CM | POA: Diagnosis not present

## 2024-01-21 DIAGNOSIS — Z79899 Other long term (current) drug therapy: Secondary | ICD-10-CM | POA: Diagnosis not present

## 2024-02-15 DIAGNOSIS — M1711 Unilateral primary osteoarthritis, right knee: Secondary | ICD-10-CM | POA: Diagnosis not present

## 2024-09-20 ENCOUNTER — Encounter (INDEPENDENT_AMBULATORY_CARE_PROVIDER_SITE_OTHER): Admitting: Ophthalmology
